# Patient Record
Sex: Female | Born: 1995 | Race: Black or African American | Hispanic: No | Marital: Single | Smoking: Never smoker
Health system: Southern US, Community
[De-identification: ages and names within clinical notes are randomized; demographics above are authoritative.]

## PROBLEM LIST (undated history)

## (undated) DIAGNOSIS — Z789 Other specified health status: Secondary | ICD-10-CM

## (undated) HISTORY — DX: Other specified health status: Z78.9

## (undated) HISTORY — PX: NO PAST SURGERIES: SHX2092

---

## 2019-03-10 NOTE — L&D Delivery Note (Addendum)
Patient is a 24 y.o. now G3P3 s/p NSVD at [redacted]w[redacted]d, who was admitted for IOL for hx of shoulder dystocia with second pregnancy, pelvis proven to 7lb4oz with last delivery.  She progressed with augmentation (cytotec, AROM) to complete and pushed 2 minutes to deliver head- LOA @1822 . Anterior shoulder behind pubic bone and shoulder dystocia identified. McRoberts and suprapubic pressure performed without resolution. Second hands called to room and Sparacino, DO arrived at bedside. NICU at bedside while other maneuvers performed. Patient repositioned to right side lying and posterior arm delivered. Left clavicle broken with delivery of posterior arm. Delivery of body after delivery of posterior arm @1824 . Cord clamped and cut by Sparacino, DO, handed to NICU for assessment. Sparacino, DO collected arterial cord gas. Placenta intact and spontaneous, bleeding minimal. No laceration identified.  Mom and baby stable prior to transfer to postpartum. She plans on breastfeeding. She requests POPs for birth control.  Delivery Note At 6:24 PM a viable and healthy female was delivered via Vaginal, Spontaneous (Presentation: Left Occiput Anterior).  APGAR: 9, ; weight 7 lb 13.6 oz (3560 g).   Placenta status: Spontaneous, Intact.  Cord: 3 vessels with the following complications:  Shoulder dystocia.  Cord pH: 7.3, bicarb 23.6  Anesthesia: None Episiotomy: None Lacerations: None Suture Repair: None Est. Blood Loss (mL):  250   Mom to postpartum.  Baby to Couplet care / Skin to Skin.  CNM 06/30/2019, 6:57 PM

## 2019-03-14 ENCOUNTER — Other Ambulatory Visit (HOSPITAL_COMMUNITY)
Admission: RE | Admit: 2019-03-14 | Discharge: 2019-03-14 | Disposition: A | Discharge status: Home / Self Care | Payer: Medicaid Other | Source: Ambulatory Visit | Attending: Obstetrics & Gynecology | Admitting: Obstetrics & Gynecology

## 2019-03-14 ENCOUNTER — Encounter: Payer: Self-pay | Admitting: Obstetrics & Gynecology

## 2019-03-14 ENCOUNTER — Ambulatory Visit (INDEPENDENT_AMBULATORY_CARE_PROVIDER_SITE_OTHER): Payer: Medicaid Other | Admitting: Obstetrics & Gynecology

## 2019-03-14 ENCOUNTER — Other Ambulatory Visit: Payer: Self-pay

## 2019-03-14 VITALS — BP 117/78 | HR 108 | Ht 64.0 in | Wt 223.0 lb

## 2019-03-14 DIAGNOSIS — Z3480 Encounter for supervision of other normal pregnancy, unspecified trimester: Secondary | ICD-10-CM | POA: Insufficient documentation

## 2019-03-14 DIAGNOSIS — O0932 Supervision of pregnancy with insufficient antenatal care, second trimester: Secondary | ICD-10-CM

## 2019-03-14 DIAGNOSIS — Z3A26 26 weeks gestation of pregnancy: Secondary | ICD-10-CM

## 2019-03-14 DIAGNOSIS — O09893 Supervision of other high risk pregnancies, third trimester: Secondary | ICD-10-CM

## 2019-03-14 DIAGNOSIS — O09892 Supervision of other high risk pregnancies, second trimester: Secondary | ICD-10-CM

## 2019-03-14 DIAGNOSIS — O0930 Supervision of pregnancy with insufficient antenatal care, unspecified trimester: Secondary | ICD-10-CM | POA: Insufficient documentation

## 2019-03-14 NOTE — Progress Notes (Signed)
Had a pap with last pregnancy in 2020

## 2019-03-14 NOTE — Progress Notes (Signed)
  Subjective:    Kelly Walter is being seen today for her first obstetrical visit.  This is not a planned pregnancy. She is at [redacted]w[redacted]d gestation. Her obstetrical history is significant for obesity and late prenatal care, short interval between pregnancies. Relationship with FOB: significant other, living together. Patient does not intend to breast feed. Pregnancy history fully reviewed.  Patient reports no complaints.  Review of Systems:   Review of Systems  She reports all normal vaginal deliveries at term.   Objective:     BP 117/78   Pulse (!) 108   Ht 5\' 4"  (1.626 m)   Wt 223 lb (101.2 kg)   LMP 09/13/2018 (Exact Date)   BMI 38.28 kg/m  Physical Exam  Exam  GENERAL: Well-developed, well-nourished female in no acute distress.  HEENT: Normocephalic, atraumatic. Sclerae anicteric.  NECK: Supple. Normal thyroid.  LUNGS: Clear to auscultation bilaterally.  HEART: Regular rate and rhythm. BREASTS: Symmetric with everted nipples. No masses, skin changes, nipple drainage, or lymphadenopathy. ABDOMEN: Soft, nontender, nondistended. No organomegaly. PELVIC: Normal external female genitalia. Vagina is pink and rugated.  Normal discharge. Normal cervix contour.  No adnexal mass or tenderness.  EXTREMITIES: No cyanosis, clubbing, or edema, 2+ distal pulses.      Assessment:    Pregnancy: 11/14/2018 Patient Active Problem List   Diagnosis Date Noted  . Supervision of other normal pregnancy, antepartum 03/14/2019  . Late prenatal care 03/14/2019  . Short interval between pregnancies affecting pregnancy in third trimester, antepartum 03/14/2019       Plan:     Initial labs drawn. Prenatal vitamins. Problem list reviewed and updated. Role of ultrasound in pregnancy discussed; fetal survey: ordered. Amniocentesis discussed: not indicated. Follow up in 2 weeks for 2 hour GTT    Abbegayle Denault C Hortencia Martire 03/14/2019

## 2019-03-15 ENCOUNTER — Encounter: Payer: Self-pay | Admitting: Obstetrics & Gynecology

## 2019-03-15 DIAGNOSIS — O99210 Obesity complicating pregnancy, unspecified trimester: Secondary | ICD-10-CM | POA: Insufficient documentation

## 2019-03-15 LAB — OBSTETRIC PANEL, INCLUDING HIV
Antibody Screen: NEGATIVE
Basophils Absolute: 0 10*3/uL (ref 0.0–0.2)
Basos: 0 %
EOS (ABSOLUTE): 0.1 10*3/uL (ref 0.0–0.4)
Eos: 1 %
HIV Screen 4th Generation wRfx: NONREACTIVE
Hematocrit: 33.3 % — ABNORMAL LOW (ref 34.0–46.6)
Hemoglobin: 10.9 g/dL — ABNORMAL LOW (ref 11.1–15.9)
Hepatitis B Surface Ag: NEGATIVE
Immature Grans (Abs): 0.1 10*3/uL (ref 0.0–0.1)
Immature Granulocytes: 1 %
Lymphocytes Absolute: 1.4 10*3/uL (ref 0.7–3.1)
Lymphs: 23 %
MCH: 27.3 pg (ref 26.6–33.0)
MCHC: 32.7 g/dL (ref 31.5–35.7)
MCV: 84 fL (ref 79–97)
Monocytes Absolute: 0.4 10*3/uL (ref 0.1–0.9)
Monocytes: 7 %
Neutrophils Absolute: 4.1 10*3/uL (ref 1.4–7.0)
Neutrophils: 68 %
Platelets: 271 10*3/uL (ref 150–450)
RBC: 3.99 x10E6/uL (ref 3.77–5.28)
RDW: 13.3 % (ref 11.7–15.4)
RPR Ser Ql: NONREACTIVE
Rh Factor: POSITIVE
Rubella Antibodies, IGG: 9.33 index (ref >0.99)
WBC: 6 10*3/uL (ref 3.4–10.8)

## 2019-03-15 LAB — HEMOGLOBIN A1C
Est. average glucose Bld gHb Est-mCnc: 91 mg/dL
Hgb A1c MFr Bld: 4.8 % (ref 4.8–5.6)

## 2019-03-16 LAB — GC/CHLAMYDIA PROBE AMP (~~LOC~~) NOT AT ARMC
Chlamydia: NEGATIVE
Comment: NEGATIVE
Comment: NORMAL
Neisseria Gonorrhea: NEGATIVE

## 2019-03-16 LAB — URINE CULTURE, OB REFLEX

## 2019-03-16 LAB — CULTURE, OB URINE

## 2019-03-17 ENCOUNTER — Encounter: Payer: Self-pay | Admitting: Obstetrics & Gynecology

## 2019-03-17 ENCOUNTER — Telehealth: Payer: Self-pay | Admitting: *Deleted

## 2019-03-17 DIAGNOSIS — O99019 Anemia complicating pregnancy, unspecified trimester: Secondary | ICD-10-CM | POA: Insufficient documentation

## 2019-03-17 NOTE — Telephone Encounter (Signed)
-----   Message from Faythe Casa, RN sent at 03/17/2019  9:32 AM EST ----- Regarding: Medication Management Was sent to [place] bu mistake.  Please advise.    Addison Naegeli, RN ----- Message ----- From: Allie Bossier, MD Sent: 03/17/2019   8:08 AM EST To: [facility]  Please let her know that she has mild anemia and should take iron daily. Thanks

## 2019-03-22 ENCOUNTER — Telehealth: Payer: Self-pay | Admitting: *Deleted

## 2019-03-22 NOTE — Telephone Encounter (Signed)
Attempted to call pt, both phone numbers ring busy.

## 2019-03-22 NOTE — Telephone Encounter (Signed)
-----   Message from Jeanetta M Bellamy, RN sent at 03/17/2019  9:32 AM EST ----- Regarding: Medication Management Was sent to us bu mistake.  Please advise.    Jeanetta, RN ----- Message ----- From: Dove, Myra C, MD Sent: 03/17/2019   8:08 AM EST To: [facility]  Please let her know that she has mild anemia and should take iron daily. Thanks   

## 2019-03-27 ENCOUNTER — Other Ambulatory Visit (HOSPITAL_COMMUNITY): Payer: Self-pay | Admitting: *Deleted

## 2019-03-27 ENCOUNTER — Other Ambulatory Visit: Payer: Self-pay | Admitting: Obstetrics & Gynecology

## 2019-03-27 ENCOUNTER — Other Ambulatory Visit: Payer: Self-pay

## 2019-03-27 ENCOUNTER — Ambulatory Visit (HOSPITAL_COMMUNITY)
Admission: RE | Admit: 2019-03-27 | Discharge: 2019-03-27 | Disposition: A | Discharge status: Home / Self Care | Payer: Medicaid Other | Source: Ambulatory Visit | Attending: Obstetrics and Gynecology | Admitting: Obstetrics and Gynecology

## 2019-03-27 DIAGNOSIS — O99212 Obesity complicating pregnancy, second trimester: Secondary | ICD-10-CM

## 2019-03-27 DIAGNOSIS — O0932 Supervision of pregnancy with insufficient antenatal care, second trimester: Secondary | ICD-10-CM

## 2019-03-27 DIAGNOSIS — O09892 Supervision of other high risk pregnancies, second trimester: Secondary | ICD-10-CM

## 2019-03-27 DIAGNOSIS — Z3480 Encounter for supervision of other normal pregnancy, unspecified trimester: Secondary | ICD-10-CM | POA: Diagnosis present

## 2019-03-27 DIAGNOSIS — Z362 Encounter for other antenatal screening follow-up: Secondary | ICD-10-CM

## 2019-03-27 DIAGNOSIS — Z3A25 25 weeks gestation of pregnancy: Secondary | ICD-10-CM

## 2019-03-30 ENCOUNTER — Encounter: Payer: Self-pay | Admitting: Obstetrics and Gynecology

## 2019-03-30 ENCOUNTER — Encounter: Payer: Medicaid Other | Admitting: Obstetrics and Gynecology

## 2019-03-31 NOTE — Progress Notes (Signed)
Patient did not keep her OB appointment for 03/30/2019.  Cornelia Copa MD Attending Center for [employer] [facility])

## 2019-04-12 ENCOUNTER — Encounter: Payer: Medicaid Other | Admitting: Advanced Practice Midwife

## 2019-04-12 ENCOUNTER — Telehealth: Payer: Self-pay | Admitting: Radiology

## 2019-04-12 ENCOUNTER — Telehealth (HOSPITAL_COMMUNITY): Payer: Self-pay

## 2019-04-12 NOTE — Telephone Encounter (Signed)
Made multiple attempts to contact patient on number provided. Phone rang busy, unable to send mychart visit due to patient not setting up. Contacted MFM to ask patient to call office to reschedule appointment that she missed to 04/12/19.

## 2019-04-20 ENCOUNTER — Telehealth: Payer: Self-pay | Admitting: Radiology

## 2019-04-20 ENCOUNTER — Encounter: Payer: Medicaid Other | Admitting: Family Medicine

## 2019-04-20 NOTE — Telephone Encounter (Signed)
Patient called after hours stating that she is waiting for COVID results and will call back to reschedule appointment from 04/20/19 for 30 WK ROB/GTT

## 2019-04-24 ENCOUNTER — Ambulatory Visit (HOSPITAL_COMMUNITY): Payer: Medicaid Other | Attending: Obstetrics

## 2019-04-24 ENCOUNTER — Encounter (HOSPITAL_COMMUNITY): Payer: Self-pay

## 2019-04-24 ENCOUNTER — Ambulatory Visit (HOSPITAL_COMMUNITY): Payer: Medicaid Other

## 2019-05-18 ENCOUNTER — Telehealth: Payer: Self-pay | Admitting: *Deleted

## 2019-05-18 NOTE — Telephone Encounter (Signed)
Attempted to call pt to get her scheduled for ROB visit. Phone number rang busy, and pt does not have mychart.

## 2019-05-23 ENCOUNTER — Other Ambulatory Visit: Payer: Self-pay

## 2019-05-23 ENCOUNTER — Other Ambulatory Visit (HOSPITAL_COMMUNITY)
Admission: RE | Admit: 2019-05-23 | Discharge: 2019-05-23 | Disposition: A | Discharge status: Home / Self Care | Payer: Medicaid Other | Source: Ambulatory Visit | Attending: Obstetrics and Gynecology | Admitting: Obstetrics and Gynecology

## 2019-05-23 ENCOUNTER — Ambulatory Visit (INDEPENDENT_AMBULATORY_CARE_PROVIDER_SITE_OTHER): Payer: Medicaid Other | Admitting: Obstetrics and Gynecology

## 2019-05-23 VITALS — BP 103/70 | HR 93 | Wt 220.0 lb

## 2019-05-23 DIAGNOSIS — O09893 Supervision of other high risk pregnancies, third trimester: Secondary | ICD-10-CM

## 2019-05-23 DIAGNOSIS — Z3401 Encounter for supervision of normal first pregnancy, first trimester: Secondary | ICD-10-CM | POA: Diagnosis present

## 2019-05-23 DIAGNOSIS — Z3480 Encounter for supervision of other normal pregnancy, unspecified trimester: Secondary | ICD-10-CM

## 2019-05-23 DIAGNOSIS — Z8759 Personal history of other complications of pregnancy, childbirth and the puerperium: Secondary | ICD-10-CM | POA: Insufficient documentation

## 2019-05-23 DIAGNOSIS — O99213 Obesity complicating pregnancy, third trimester: Secondary | ICD-10-CM

## 2019-05-23 DIAGNOSIS — O283 Abnormal ultrasonic finding on antenatal screening of mother: Secondary | ICD-10-CM | POA: Insufficient documentation

## 2019-05-23 DIAGNOSIS — O0933 Supervision of pregnancy with insufficient antenatal care, third trimester: Secondary | ICD-10-CM | POA: Insufficient documentation

## 2019-05-23 DIAGNOSIS — E669 Obesity, unspecified: Secondary | ICD-10-CM | POA: Insufficient documentation

## 2019-05-23 DIAGNOSIS — Z3A36 36 weeks gestation of pregnancy: Secondary | ICD-10-CM

## 2019-05-23 DIAGNOSIS — O99210 Obesity complicating pregnancy, unspecified trimester: Secondary | ICD-10-CM

## 2019-05-23 NOTE — Progress Notes (Signed)
No concerns. 

## 2019-05-23 NOTE — Progress Notes (Signed)
Prenatal Visit Note Date: 05/23/2019 Clinic: [facility]  Subjective:  Kelly Walter is a 24 y.o. G3P2002 at [redacted]w[redacted]d being seen today for ongoing prenatal care.  She is currently monitored for the following issues for this low-risk pregnancy and has Supervision of other normal pregnancy, antepartum; Late prenatal care; Short interval between pregnancies affecting pregnancy in third trimester, antepartum; Obesity in pregnancy; Anemia in pregnancy; Echogenic intracardiac focus of fetus on prenatal ultrasound; Insufficient prenatal care in third trimester; History of shoulder dystocia in prior pregnancy; and Obesity (BMI 30-39.9) on their problem list.  Patient reports no complaints.   Contractions: Not present. Vag. Bleeding: None.  Movement: Present. Denies leaking of fluid.   The following portions of the patient's history were reviewed and updated as appropriate: allergies, current medications, past family history, past medical history, past social history, past surgical history and problem list. Problem list updated.  Objective:   Vitals:   05/23/19 0914  BP: 103/70  Pulse: 93  Weight: 220 lb (99.8 kg)    Fetal Status: Fetal Heart Rate (bpm): 160 Fundal Height: 35 cm Movement: Present  Presentation: Vertex  General:  Alert, oriented and cooperative. Patient is in no acute distress.  Skin: Skin is warm and dry. No rash noted.   Cardiovascular: Normal heart rate noted  Respiratory: Normal respiratory effort, no problems with respiration noted  Abdomen: Soft, gravid, appropriate for gestational age. Pain/Pressure: Present     Pelvic:  Cervical exam deferred        Extremities: Normal range of motion.  Edema: Trace  Mental Status: Normal mood and affect. Normal behavior. Normal judgment and thought content.   Urinalysis:      Assessment and Plan:  Pregnancy: G3P2002 at [redacted]w[redacted]d  1. Encounter for supervision of normal first pregnancy in first trimester Routine care.  BC options d/w her, namely ones she can get before hospital d/c - Glucose Tolerance, 2 Hours w/1 Hour - Culture, beta strep (group b only) - GC/Chlamydia probe amp ([place])not at [facility] - CBC - RPR - HIV antibody (with reflex)  2. Echogenic intracardiac focus of fetus on prenatal ultrasound Pt elects for exp management  3. Insufficient prenatal care in third trimester Follow up rpt u/s due to poor dating and growth (see below)  4. History of shoulder dystocia in prior pregnancy Care everywhere reviewed and PL updated. Pt states child is healthy and doing well w/o issue. First child was same size as 2nd and no SD in G1 delivery. I told her I recommend d/w her more after growth u/s but I dont recommend a c/s based on G2 delivery at this point  5. Obesity (BMI 30-39.9)  6. Supervision of other normal pregnancy, antepartum  7. Short interval between pregnancies affecting pregnancy in third trimester, antepartum  8. Obesity in pregnancy  Preterm labor symptoms and general obstetric precautions including but not limited to vaginal bleeding, contractions, leaking of fluid and fetal movement were reviewed in detail with the patient. Please refer to After Visit Summary for other counseling recommendations.  Return for virtual visit. Repeat growth u/s in next 1-2wks 7-10d low risk ob  [place]al[place] Bing, MD

## 2019-05-24 LAB — CBC
Hematocrit: 34.4 % (ref 34.0–46.6)
Hemoglobin: 10.9 g/dL — ABNORMAL LOW (ref 11.1–15.9)
MCH: 25.5 pg — ABNORMAL LOW (ref 26.6–33.0)
MCHC: 31.7 g/dL (ref 31.5–35.7)
MCV: 81 fL (ref 79–97)
Platelets: 242 10*3/uL (ref 150–450)
RBC: 4.27 x10E6/uL (ref 3.77–5.28)
RDW: 14.4 % (ref 11.7–15.4)
WBC: 4.5 10*3/uL (ref 3.4–10.8)

## 2019-05-24 LAB — GLUCOSE TOLERANCE, 2 HOURS W/ 1HR
Glucose, 1 hour: 120 mg/dL (ref 65–179)
Glucose, 2 hour: 93 mg/dL (ref 65–152)
Glucose, Fasting: 75 mg/dL (ref 65–91)

## 2019-05-24 LAB — RPR: RPR Ser Ql: NONREACTIVE

## 2019-05-24 LAB — GC/CHLAMYDIA PROBE AMP (~~LOC~~) NOT AT ARMC
Chlamydia: NEGATIVE
Comment: NEGATIVE
Comment: NORMAL
Neisseria Gonorrhea: NEGATIVE

## 2019-05-24 LAB — HIV ANTIBODY (ROUTINE TESTING W REFLEX): HIV Screen 4th Generation wRfx: NONREACTIVE

## 2019-05-27 LAB — CULTURE, BETA STREP (GROUP B ONLY): Strep Gp B Culture: NEGATIVE

## 2019-05-31 ENCOUNTER — Encounter: Payer: Self-pay | Admitting: Family Medicine

## 2019-05-31 ENCOUNTER — Telehealth (INDEPENDENT_AMBULATORY_CARE_PROVIDER_SITE_OTHER): Payer: Medicaid Other | Admitting: Family Medicine

## 2019-05-31 ENCOUNTER — Other Ambulatory Visit: Payer: Self-pay

## 2019-05-31 VITALS — Wt 220.0 lb

## 2019-05-31 DIAGNOSIS — O99210 Obesity complicating pregnancy, unspecified trimester: Secondary | ICD-10-CM

## 2019-05-31 DIAGNOSIS — O0933 Supervision of pregnancy with insufficient antenatal care, third trimester: Secondary | ICD-10-CM | POA: Diagnosis not present

## 2019-05-31 DIAGNOSIS — O99213 Obesity complicating pregnancy, third trimester: Secondary | ICD-10-CM

## 2019-05-31 DIAGNOSIS — Z3A37 37 weeks gestation of pregnancy: Secondary | ICD-10-CM

## 2019-05-31 DIAGNOSIS — Z8759 Personal history of other complications of pregnancy, childbirth and the puerperium: Secondary | ICD-10-CM

## 2019-05-31 DIAGNOSIS — Z3480 Encounter for supervision of other normal pregnancy, unspecified trimester: Secondary | ICD-10-CM

## 2019-05-31 DIAGNOSIS — O0930 Supervision of pregnancy with insufficient antenatal care, unspecified trimester: Secondary | ICD-10-CM

## 2019-05-31 DIAGNOSIS — O09293 Supervision of pregnancy with other poor reproductive or obstetric history, third trimester: Secondary | ICD-10-CM | POA: Diagnosis not present

## 2019-05-31 NOTE — Progress Notes (Signed)
I connected with@ on 06/03/19 at  2:15 PM EDT by: telephone- mychart connection pportand verified that I am speaking with the correct person using two identifiers.  Patient is located at [facility] on [address]  and provider is located at [facility].     The purpose of this virtual visit is to provide medical care while limiting exposure to the novel coronavirus. I discussed the limitations, risks, security and privacy concerns of performing an evaluation and management service by Mychart and the availability of in person appointments. I also discussed with the patient that there may be a patient responsible charge related to this service. By engaging in this virtual visit, you consent to the provision of healthcare.  Additionally, you authorize for your insurance to be billed for the services provided during this visit.  The patient expressed understanding and agreed to proceed.  The following staff members participated in the virtual visit:  Matthew Saras    PRENATAL VISIT NOTE  Subjective:  Kelly Walter is a 24 y.o. G3P2002 at [redacted]w[redacted]d  for phone visit for ongoing prenatal care.  She is currently monitored for the following issues for this high-risk pregnancy and has Supervision of other normal pregnancy, antepartum; Late prenatal care; Short interval between pregnancies affecting pregnancy in third trimester, antepartum; Obesity in pregnancy; Anemia in pregnancy; Echogenic intracardiac focus of fetus on prenatal ultrasound; Insufficient prenatal care in third trimester; History of shoulder dystocia in prior pregnancy; and Obesity (BMI 30-39.9) on their problem list.  Patient reports no complaints. Baby is moving well. Says she wants "check on her blood work to make sure the baby is ok because of her [place] and they saw a spot in the baby's heart." Denies leaking of fluid.   The following portions of the patient's history were reviewed and updated as appropriate: allergies, current medications, past family  history, past medical history, past social history, past surgical history and problem list.   Objective:   Vitals:   05/31/19 1434  Weight: 220 lb (99.8 kg)   Self-Obtained  Fetal Status:          Movement present  Assessment and Plan:  Pregnancy: G3P2002 at [redacted]w[redacted]d  1. Obesity in pregnancy TWG=0 lb (0 kg)   2. Supervision of other normal pregnancy, antepartum Up to date 36 wks labs negative Patient asking about NIPs today Asked about plans for contraception and she is unsure  3. Late prenatal care Explained that genetic screening was not done her per request at the her first prenatal visit at 26 wks. She would like NIPT today because she "wants to know if the spot in her baby's heart is something to worry about." I explained that NIPT might not be available prior to delivery given she is late to request this. Additionally she has only had 2 other visits this pregnancy despite this telehealth visit.   4. History of shoulder dystocia in prior pregnancy [place] tomorrow to assess fetal weight  Term labor symptoms and general obstetric precautions including but not limited to vaginal bleeding, contractions, leaking of fluid and fetal movement were reviewed in detail with the patient.  Return in about 2 weeks (around 06/14/2019) for Routine prenatal care, Telehealth/Virtual health OB Visit.  Future Appointments  Date Time Provider Department Center  06/06/2019  9:30 AM [place] Bing, MD [facility]     Time spent on virtual visit: 10 minutes  Federico Flake, MD

## 2019-05-31 NOTE — Progress Notes (Signed)
I connected with  [name] on 05/31/19 at  2:15 PM EDT by telephone and verified that I am speaking with the correct person using two identifiers.   I discussed the limitations, risks, security and privacy concerns of performing an evaluation and management service by telephone and the availability of in person appointments. I also discussed with the patient that there may be a patient responsible charge related to this service. The patient expressed understanding and agreed to proceed.  Clerance Umland Emeline Darling, [facility] 05/31/2019  2:35 PM  Patient has not taken blood pressure

## 2019-06-01 ENCOUNTER — Ambulatory Visit (HOSPITAL_COMMUNITY)
Admission: RE | Admit: 2019-06-01 | Discharge: 2019-06-01 | Disposition: A | Discharge status: Home / Self Care | Payer: Medicaid Other | Source: Ambulatory Visit | Attending: Obstetrics | Admitting: Obstetrics

## 2019-06-01 ENCOUNTER — Other Ambulatory Visit: Payer: Self-pay

## 2019-06-01 DIAGNOSIS — O09893 Supervision of other high risk pregnancies, third trimester: Secondary | ICD-10-CM

## 2019-06-01 DIAGNOSIS — Z362 Encounter for other antenatal screening follow-up: Secondary | ICD-10-CM | POA: Diagnosis present

## 2019-06-01 DIAGNOSIS — E669 Obesity, unspecified: Secondary | ICD-10-CM

## 2019-06-01 DIAGNOSIS — Z3687 Encounter for antenatal screening for uncertain dates: Secondary | ICD-10-CM | POA: Diagnosis not present

## 2019-06-01 DIAGNOSIS — O0933 Supervision of pregnancy with insufficient antenatal care, third trimester: Secondary | ICD-10-CM | POA: Diagnosis not present

## 2019-06-01 DIAGNOSIS — O99213 Obesity complicating pregnancy, third trimester: Secondary | ICD-10-CM

## 2019-06-01 DIAGNOSIS — Z3A35 35 weeks gestation of pregnancy: Secondary | ICD-10-CM

## 2019-06-06 ENCOUNTER — Telehealth (INDEPENDENT_AMBULATORY_CARE_PROVIDER_SITE_OTHER): Payer: Medicaid Other | Admitting: Obstetrics and Gynecology

## 2019-06-06 ENCOUNTER — Other Ambulatory Visit: Payer: Self-pay

## 2019-06-06 DIAGNOSIS — Z3480 Encounter for supervision of other normal pregnancy, unspecified trimester: Secondary | ICD-10-CM

## 2019-06-06 DIAGNOSIS — E669 Obesity, unspecified: Secondary | ICD-10-CM | POA: Diagnosis not present

## 2019-06-06 DIAGNOSIS — Z6838 Body mass index (BMI) 38.0-38.9, adult: Secondary | ICD-10-CM

## 2019-06-06 DIAGNOSIS — O99210 Obesity complicating pregnancy, unspecified trimester: Secondary | ICD-10-CM

## 2019-06-06 DIAGNOSIS — Z8759 Personal history of other complications of pregnancy, childbirth and the puerperium: Secondary | ICD-10-CM

## 2019-06-06 NOTE — Progress Notes (Signed)
   TELEHEALTH VIRTUAL OBSTETRICS VISIT ENCOUNTER NOTE  Clinic: [facility]  I connected with Kelly Walter on 06/06/19 at  9:30 AM EDT by telephone at home and verified that I am speaking with the correct person using two identifiers.   I discussed the limitations, risks, security and privacy concerns of performing an evaluation and management service by telephone and the availability of in person appointments. I also discussed with the patient that there may be a patient responsible charge related to this service. The patient expressed understanding and agreed to proceed.  Subjective:  Kelly Walter is a 24 y.o. G3P2002 at [redacted]w[redacted]d being followed for ongoing prenatal care.  She is currently monitored for the following issues for this high-risk pregnancy and has Supervision of other normal pregnancy, antepartum; Late prenatal care; Short interval between pregnancies affecting pregnancy in third trimester, antepartum; Obesity in pregnancy; Echogenic intracardiac focus of fetus on prenatal ultrasound; Insufficient prenatal care in third trimester; History of shoulder dystocia in prior pregnancy; and Obesity (BMI 30-39.9) on their problem list.  Patient reports no complaints. Reports fetal movement. Denies any contractions, bleeding or leaking of fluid.   The following portions of the patient's history were reviewed and updated as appropriate: allergies, current medications, past family history, past medical history, past social history, past surgical history and problem list.   Objective:   Vitals:    Babyscripts Data Reviewed: not applicable  General:  Alert, oriented and cooperative.   Mental Status: Normal mood and affect perceived. Normal judgment and thought content.  Rest of physical exam deferred due to type of encounter  Assessment and Plan:  Pregnancy: G3P2002 at [redacted]w[redacted]d 1. History of shoulder dystocia in prior pregnancy Normal growth u/s last week and c/w prior  pregnancies. I told her that at this point I don't think she needs a c-section based purely on her G2 delivery. I told her that we'll keep a close watch of her labor curve and have extra help in the room for this delivery  2. Supervision of other normal pregnancy, antepartum Routine care  3. Obesity in pregnancy  4. Obesity (BMI 30-39.9)  Term labor symptoms and general obstetric precautions including but not limited to vaginal bleeding, contractions, leaking of fluid and fetal movement were reviewed in detail with the patient.  I discussed the assessment and treatment plan with the patient. The patient was provided an opportunity to ask questions and all were answered. The patient agreed with the plan and demonstrated an understanding of the instructions. The patient was advised to call back or seek an in-person office evaluation/go to [facility] at [facility] for any urgent or concerning symptoms. Please refer to After Visit Summary for other counseling recommendations.   I provided 7 minutes of non-face-to-face time during this encounter. The visit was conducted via MyChart-medicine  No follow-ups on file.  Future Appointments  Date Time Provider Department Center  06/15/2019  2:15 PM Anyanwu, Jethro Bastos, MD [facility]    [place] Bing, MD Center for [facility], [facility]

## 2019-06-15 ENCOUNTER — Encounter: Payer: Medicaid Other | Admitting: Obstetrics & Gynecology

## 2019-06-15 NOTE — Progress Notes (Deleted)
   Patient did not show up today for her scheduled appointment.   Shyniece Scripter, MD, FACOG Obstetrician & Gynecologist, [facility], [facility]  

## 2019-06-19 ENCOUNTER — Other Ambulatory Visit: Payer: Self-pay

## 2019-06-19 ENCOUNTER — Ambulatory Visit (INDEPENDENT_AMBULATORY_CARE_PROVIDER_SITE_OTHER): Payer: Medicaid Other | Admitting: Obstetrics and Gynecology

## 2019-06-19 VITALS — BP 116/77 | HR 103 | Wt 218.0 lb

## 2019-06-19 DIAGNOSIS — Z3483 Encounter for supervision of other normal pregnancy, third trimester: Secondary | ICD-10-CM

## 2019-06-19 DIAGNOSIS — Z3480 Encounter for supervision of other normal pregnancy, unspecified trimester: Secondary | ICD-10-CM

## 2019-06-19 DIAGNOSIS — Z3A37 37 weeks gestation of pregnancy: Secondary | ICD-10-CM

## 2019-06-19 NOTE — Progress Notes (Signed)
Prenatal Visit Note Date: 06/19/2019 Clinic: [facility]  Subjective:  Kelly Walter is a 24 y.o. G3P2002 at [redacted]w[redacted]d being seen today for ongoing prenatal care.  She is currently monitored for the following issues for this low-risk pregnancy and has Supervision of other normal pregnancy, antepartum; Late prenatal care; Short interval between pregnancies affecting pregnancy in third trimester, antepartum; Obesity in pregnancy; Echogenic intracardiac focus of fetus on prenatal ultrasound; Insufficient prenatal care in third trimester; History of shoulder dystocia in prior pregnancy; and Obesity (BMI 30-39.9) on their problem list.  Patient reports no complaints.   Contractions: Irregular. Vag. Bleeding: None.  Movement: Present. Denies leaking of fluid.   The following portions of the patient's history were reviewed and updated as appropriate: allergies, current medications, past family history, past medical history, past social history, past surgical history and problem list. Problem list updated.  Objective:   Vitals:   06/19/19 1416  BP: 116/77  Pulse: (!) 103  Weight: 218 lb (98.9 kg)    Fetal Status: Fetal Heart Rate (bpm): 138 Fundal Height: 37 cm Movement: Present  Presentation: Vertex  General:  Alert, oriented and cooperative. Patient is in no acute distress.  Skin: Skin is warm and dry. No rash noted.   Cardiovascular: Normal heart rate noted  Respiratory: Normal respiratory effort, no problems with respiration noted  Abdomen: Soft, gravid, appropriate for gestational age. Pain/Pressure: Absent     Pelvic:  Cervical exam performed        Extremities: Normal range of motion.  Edema: None  Mental Status: Normal mood and affect. Normal behavior. Normal judgment and thought content.   Urinalysis:      Assessment and Plan:  Pregnancy: G3P2002 at [redacted]w[redacted]d  1. Supervision of other normal pregnancy, antepartum Per last u/s, mfm changed her dates back two weeks.  Today, her updated gestational age is 37/4 days today. Rpt GBS done in case prior one expires. Patient prefers in person. OCPs or the patch - Strep Gp B NAA  Term labor symptoms and general obstetric precautions including but not limited to vaginal bleeding, contractions, leaking of fluid and fetal movement were reviewed in detail with the patient. Please refer to After Visit Summary for other counseling recommendations.  Return in about 1 week (around 06/26/2019) for low risk, in person.   [place] Bing, MD

## 2019-06-21 LAB — STREP GP B NAA: Strep Gp B NAA: NEGATIVE

## 2019-06-27 ENCOUNTER — Ambulatory Visit (INDEPENDENT_AMBULATORY_CARE_PROVIDER_SITE_OTHER): Payer: Medicaid Other | Admitting: Obstetrics and Gynecology

## 2019-06-27 ENCOUNTER — Other Ambulatory Visit: Payer: Self-pay

## 2019-06-27 VITALS — BP 114/76 | HR 96 | Wt 219.6 lb

## 2019-06-27 DIAGNOSIS — Z3480 Encounter for supervision of other normal pregnancy, unspecified trimester: Secondary | ICD-10-CM

## 2019-06-27 DIAGNOSIS — Z8759 Personal history of other complications of pregnancy, childbirth and the puerperium: Secondary | ICD-10-CM

## 2019-06-27 DIAGNOSIS — Z3A38 38 weeks gestation of pregnancy: Secondary | ICD-10-CM

## 2019-06-27 NOTE — Progress Notes (Signed)
Prenatal Visit Note Date: 06/27/2019 Clinic: [facility]  Subjective:  Kelly Walter is a 24 y.o. G3P2002 at [redacted]w[redacted]d being seen today for ongoing prenatal care.  She is currently monitored for the following issues for this high-risk pregnancy and has Supervision of other normal pregnancy, antepartum; Late prenatal care; Short interval between pregnancies affecting pregnancy in third trimester, antepartum; Obesity in pregnancy; Echogenic intracardiac focus of fetus on prenatal ultrasound; Insufficient prenatal care in third trimester; History of shoulder dystocia in prior pregnancy; and Obesity (BMI 30-39.9) on their problem list.  Patient reports no complaints.   Contractions: Not present. Vag. Bleeding: None.  Movement: Present. Denies leaking of fluid.   The following portions of the patient's history were reviewed and updated as appropriate: allergies, current medications, past family history, past medical history, past social history, past surgical history and problem list. Problem list updated.  Objective:   Vitals:   06/27/19 1412  BP: 114/76  Pulse: 96  Weight: 219 lb 9.6 oz (99.6 kg)    Fetal Status: Fetal Heart Rate (bpm): 132 Fundal Height: 39 cm Movement: Present  Presentation: Vertex  General:  Alert, oriented and cooperative. Patient is in no acute distress.  Skin: Skin is warm and dry. No rash noted.   Cardiovascular: Normal heart rate noted  Respiratory: Normal respiratory effort, no problems with respiration noted  Abdomen: Soft, gravid, appropriate for gestational age. Pain/Pressure: Absent     Pelvic:  Cervical exam performed Dilation: 1 Effacement (%): 50 Station: Ballotable  Extremities: Normal range of motion.  Edema: Trace  Mental Status: Normal mood and affect. Normal behavior. Normal judgment and thought content.   Urinalysis:      Assessment and Plan:  Pregnancy: G3P2002 at [redacted]w[redacted]d  1. Supervision of other normal pregnancy,  antepartum Routine care  2. History of shoulder dystocia in prior pregnancy 3/25 growth with efw 67%, 2737gm, ac 90% with normal afi and cephalic. Pt had 3290gm SD with last pregnancy (see PL). D/w her and that I feel it would be reasonable as a multip to offer her a 39wk elective IOL to potentially decrease risk of SD. Pt amenable to this. Will set up for patient  Term labor symptoms and general obstetric precautions including but not limited to vaginal bleeding, contractions, leaking of fluid and fetal movement were reviewed in detail with the patient. Please refer to After Visit Summary for other counseling recommendations.  Return in about 1 week (around 07/04/2019) for low risk virtual okay.   [place] Bing, MD

## 2019-06-28 ENCOUNTER — Other Ambulatory Visit: Payer: Self-pay | Admitting: Advanced Practice Midwife

## 2019-06-28 ENCOUNTER — Telehealth (HOSPITAL_COMMUNITY): Payer: Self-pay | Admitting: *Deleted

## 2019-06-28 NOTE — Telephone Encounter (Signed)
Preadmission screen  

## 2019-06-29 ENCOUNTER — Other Ambulatory Visit (HOSPITAL_COMMUNITY)
Admission: RE | Admit: 2019-06-29 | Discharge: 2019-06-29 | Disposition: A | Discharge status: Home / Self Care | Payer: Medicaid Other | Source: Ambulatory Visit | Attending: Family Medicine | Admitting: Family Medicine

## 2019-06-29 LAB — SARS CORONAVIRUS 2 (TAT 6-24 HRS): SARS Coronavirus 2: NEGATIVE

## 2019-06-30 ENCOUNTER — Inpatient Hospital Stay (HOSPITAL_COMMUNITY): Payer: Medicaid Other

## 2019-06-30 ENCOUNTER — Other Ambulatory Visit: Payer: Self-pay

## 2019-06-30 ENCOUNTER — Encounter (HOSPITAL_COMMUNITY): Payer: Self-pay | Admitting: Obstetrics and Gynecology

## 2019-06-30 ENCOUNTER — Inpatient Hospital Stay (HOSPITAL_COMMUNITY)
Admission: AD | Admit: 2019-06-30 | Discharge: 2019-07-02 | DRG: 807 | Disposition: A | Discharge status: Home / Self Care | Payer: Medicaid Other | Attending: Obstetrics and Gynecology | Admitting: Obstetrics and Gynecology

## 2019-06-30 DIAGNOSIS — E669 Obesity, unspecified: Secondary | ICD-10-CM | POA: Diagnosis present

## 2019-06-30 DIAGNOSIS — Z3A39 39 weeks gestation of pregnancy: Secondary | ICD-10-CM

## 2019-06-30 DIAGNOSIS — O99214 Obesity complicating childbirth: Secondary | ICD-10-CM | POA: Diagnosis present

## 2019-06-30 DIAGNOSIS — Z3490 Encounter for supervision of normal pregnancy, unspecified, unspecified trimester: Secondary | ICD-10-CM | POA: Diagnosis present

## 2019-06-30 DIAGNOSIS — O0930 Supervision of pregnancy with insufficient antenatal care, unspecified trimester: Secondary | ICD-10-CM

## 2019-06-30 DIAGNOSIS — Z8759 Personal history of other complications of pregnancy, childbirth and the puerperium: Secondary | ICD-10-CM

## 2019-06-30 DIAGNOSIS — Z20822 Contact with and (suspected) exposure to covid-19: Secondary | ICD-10-CM | POA: Diagnosis present

## 2019-06-30 DIAGNOSIS — Z3480 Encounter for supervision of other normal pregnancy, unspecified trimester: Secondary | ICD-10-CM

## 2019-06-30 DIAGNOSIS — O26893 Other specified pregnancy related conditions, third trimester: Secondary | ICD-10-CM | POA: Diagnosis present

## 2019-06-30 LAB — CBC
HCT: 32.8 % — ABNORMAL LOW (ref 36.0–46.0)
Hemoglobin: 10 g/dL — ABNORMAL LOW (ref 12.0–15.0)
MCH: 24.6 pg — ABNORMAL LOW (ref 26.0–34.0)
MCHC: 30.5 g/dL (ref 30.0–36.0)
MCV: 80.6 fL (ref 80.0–100.0)
Platelets: 255 10*3/uL (ref 150–400)
RBC: 4.07 MIL/uL (ref 3.87–5.11)
RDW: 15.2 % (ref 11.5–15.5)
WBC: 4.2 10*3/uL (ref 4.0–10.5)
nRBC: 0 % (ref 0.0–0.2)

## 2019-06-30 LAB — TYPE AND SCREEN
ABO/RH(D): AB POS
Antibody Screen: NEGATIVE

## 2019-06-30 LAB — RPR: RPR Ser Ql: NONREACTIVE

## 2019-06-30 LAB — ABO/RH: ABO/RH(D): AB POS

## 2019-06-30 MED ORDER — ACETAMINOPHEN 325 MG PO TABS
650.0000 mg | ORAL_TABLET | ORAL | Status: DC | PRN
  Administered 2019-07-01 – 2019-07-02 (×2): 650 mg via ORAL
  Filled 2019-06-30 (×2): qty 2

## 2019-06-30 MED ORDER — OXYCODONE HCL 5 MG PO TABS
5.0000 mg | ORAL_TABLET | ORAL | Status: DC | PRN
  Administered 2019-07-01 – 2019-07-02 (×2): 5 mg via ORAL
  Filled 2019-06-30 (×2): qty 1

## 2019-06-30 MED ORDER — ONDANSETRON HCL 4 MG PO TABS: 4.0000 mg | ORAL_TABLET | ORAL | Status: DC | PRN

## 2019-06-30 MED ORDER — DIPHENHYDRAMINE HCL 25 MG PO CAPS: 25.0000 mg | ORAL_CAPSULE | Freq: Four times a day (QID) | ORAL | Status: DC | PRN

## 2019-06-30 MED ORDER — LACTATED RINGERS IV SOLN: INTRAVENOUS | Status: DC

## 2019-06-30 MED ORDER — MISOPROSTOL 25 MCG QUARTER TABLET
25.0000 ug | ORAL_TABLET | ORAL | Status: DC | PRN
  Administered 2019-06-30: 25 ug via VAGINAL
  Filled 2019-06-30: qty 1

## 2019-06-30 MED ORDER — PRENATAL MULTIVITAMIN CH
1.0000 | ORAL_TABLET | Freq: Every day | ORAL | Status: DC
  Administered 2019-07-01 – 2019-07-02 (×2): 1 via ORAL
  Filled 2019-06-30 (×2): qty 1

## 2019-06-30 MED ORDER — IBUPROFEN 600 MG PO TABS
600.0000 mg | ORAL_TABLET | Freq: Four times a day (QID) | ORAL | Status: DC
  Administered 2019-07-01 – 2019-07-02 (×7): 600 mg via ORAL
  Filled 2019-06-30 (×7): qty 1

## 2019-06-30 MED ORDER — OXYTOCIN 40 UNITS IN NORMAL SALINE INFUSION - SIMPLE MED
2.5000 [IU]/h | INTRAVENOUS | Status: DC
  Filled 2019-06-30: qty 1000

## 2019-06-30 MED ORDER — FENTANYL CITRATE (PF) 100 MCG/2ML IJ SOLN
50.0000 ug | INTRAMUSCULAR | Status: DC | PRN
  Filled 2019-06-30: qty 2

## 2019-06-30 MED ORDER — ZOLPIDEM TARTRATE 5 MG PO TABS: 5.0000 mg | ORAL_TABLET | Freq: Every evening | ORAL | Status: DC | PRN

## 2019-06-30 MED ORDER — SOD CITRATE-CITRIC ACID 500-334 MG/5ML PO SOLN: 30.0000 mL | ORAL | Status: DC | PRN

## 2019-06-30 MED ORDER — FENTANYL CITRATE (PF) 100 MCG/2ML IJ SOLN
100.0000 ug | INTRAMUSCULAR | Status: DC | PRN
  Administered 2019-06-30: 100 ug via INTRAVENOUS

## 2019-06-30 MED ORDER — WITCH HAZEL-GLYCERIN EX PADS: 1.0000 "application " | MEDICATED_PAD | CUTANEOUS | Status: DC | PRN

## 2019-06-30 MED ORDER — COCONUT OIL OIL: 1.0000 "application " | TOPICAL_OIL | Status: DC | PRN

## 2019-06-30 MED ORDER — ONDANSETRON HCL 4 MG/2ML IJ SOLN: 4.0000 mg | Freq: Four times a day (QID) | INTRAMUSCULAR | Status: DC | PRN

## 2019-06-30 MED ORDER — DIPHENHYDRAMINE HCL 50 MG/ML IJ SOLN: 12.5000 mg | INTRAMUSCULAR | Status: DC | PRN

## 2019-06-30 MED ORDER — BENZOCAINE-MENTHOL 20-0.5 % EX AERO: 1.0000 "application " | INHALATION_SPRAY | CUTANEOUS | Status: DC | PRN

## 2019-06-30 MED ORDER — FENTANYL-BUPIVACAINE-NACL 0.5-0.125-0.9 MG/250ML-% EP SOLN
12.0000 mL/h | EPIDURAL | Status: DC | PRN
  Filled 2019-06-30: qty 250

## 2019-06-30 MED ORDER — PROMETHAZINE HCL 25 MG/ML IJ SOLN
25.0000 mg | Freq: Once | INTRAMUSCULAR | Status: AC
  Administered 2019-06-30: 25 mg via INTRAVENOUS
  Filled 2019-06-30: qty 1

## 2019-06-30 MED ORDER — TERBUTALINE SULFATE 1 MG/ML IJ SOLN: 0.2500 mg | Freq: Once | INTRAMUSCULAR | Status: DC | PRN

## 2019-06-30 MED ORDER — ONDANSETRON HCL 4 MG/2ML IJ SOLN: 4.0000 mg | INTRAMUSCULAR | Status: DC | PRN

## 2019-06-30 MED ORDER — OXYTOCIN 40 UNITS IN NORMAL SALINE INFUSION - SIMPLE MED: 1.0000 m[IU]/min | INTRAVENOUS | Status: DC

## 2019-06-30 MED ORDER — SENNOSIDES-DOCUSATE SODIUM 8.6-50 MG PO TABS
2.0000 | ORAL_TABLET | ORAL | Status: DC
  Administered 2019-07-01 (×2): 2 via ORAL
  Filled 2019-06-30 (×2): qty 2

## 2019-06-30 MED ORDER — OXYTOCIN BOLUS FROM INFUSION
500.0000 mL | Freq: Once | INTRAVENOUS | Status: AC
  Administered 2019-06-30: 500 mL via INTRAVENOUS

## 2019-06-30 MED ORDER — LACTATED RINGERS IV SOLN: 500.0000 mL | INTRAVENOUS | Status: DC | PRN

## 2019-06-30 MED ORDER — DIBUCAINE (PERIANAL) 1 % EX OINT: 1.0000 "application " | TOPICAL_OINTMENT | CUTANEOUS | Status: DC | PRN

## 2019-06-30 MED ORDER — EPHEDRINE 5 MG/ML INJ: 10.0000 mg | INTRAVENOUS | Status: DC | PRN

## 2019-06-30 MED ORDER — ACETAMINOPHEN 325 MG PO TABS
650.0000 mg | ORAL_TABLET | ORAL | Status: DC | PRN
  Administered 2019-06-30: 650 mg via ORAL
  Filled 2019-06-30 (×2): qty 2

## 2019-06-30 MED ORDER — LACTATED RINGERS IV SOLN: 500.0000 mL | Freq: Once | INTRAVENOUS | Status: DC

## 2019-06-30 MED ORDER — PHENYLEPHRINE 40 MCG/ML (10ML) SYRINGE FOR IV PUSH (FOR BLOOD PRESSURE SUPPORT): 80.0000 ug | PREFILLED_SYRINGE | INTRAVENOUS | Status: DC | PRN

## 2019-06-30 MED ORDER — LIDOCAINE HCL (PF) 1 % IJ SOLN: 30.0000 mL | INTRAMUSCULAR | Status: DC | PRN

## 2019-06-30 MED ORDER — TETANUS-DIPHTH-ACELL PERTUSSIS 5-2.5-18.5 LF-MCG/0.5 IM SUSP: 0.5000 mL | Freq: Once | INTRAMUSCULAR | Status: DC

## 2019-06-30 MED ORDER — SIMETHICONE 80 MG PO CHEW: 80.0000 mg | CHEWABLE_TABLET | ORAL | Status: DC | PRN

## 2019-06-30 NOTE — H&P (Addendum)
OBSTETRIC ADMISSION HISTORY AND PHYSICAL  Blanka Cavey is a 24 y.o. female G15P2002 with IUP at [redacted]w[redacted]d by ultrasound presenting for IOL for hx of shoulder dystocia with prior VD. She reports +FMs, No LOF, no VB, no blurry vision, headaches or peripheral edema, and RUQ pain.  She plans on bottle feeding. She request OCPs for birth control. She received her prenatal care at [facility]   Dating: By ultrasound on 3/25 --->  Estimated Date of Delivery: 07/06/19  Sono:    @[redacted]w[redacted]d , CWD, normal anatomy, cephalic presentation, 8295A, 67% EFW  Prenatal History/Complications:  - history of shoulder dystocia in prior pregnancy  - late prenatal care  - short interval between pregnancies  -obesity in pregnancy   Past Medical History: Past Medical History:  Diagnosis Date  . Medical history non-contributory     Past Surgical History: Past Surgical History:  Procedure Laterality Date  . NO PAST SURGERIES      Obstetrical History: OB History    Gravida  3   Para  2   Term  2   Preterm      AB      Living  2     SAB      TAB      Ectopic      Multiple      Live Births  2           Social History Social History   Socioeconomic History  . Marital status: Single    Spouse name: Not on file  . Number of children: Not on file  . Years of education: Not on file  . Highest education level: Not on file  Occupational History  . Not on file  Tobacco Use  . Smoking status: Never Smoker  . Smokeless tobacco: Never Used  Substance and Sexual Activity  . Alcohol use: Not Currently  . Drug use: Not Currently  . Sexual activity: Yes    Birth control/protection: None  Other Topics Concern  . Not on file  Social History Narrative  . Not on file   Social Determinants of Health   Financial Resource Strain:   . Difficulty of Paying Living Expenses:   Food Insecurity:   . Worried About Charity fundraiser in the Last Year:   . Arboriculturist in the Last Year:    Transportation Needs:   . Film/video editor (Medical):   Marland Kitchen Lack of Transportation (Non-Medical):   Physical Activity:   . Days of Exercise per Week:   . Minutes of Exercise per Session:   Stress:   . Feeling of Stress :   Social Connections:   . Frequency of Communication with Friends and Family:   . Frequency of Social Gatherings with Friends and Family:   . Attends Religious Services:   . Active Member of Clubs or Organizations:   . Attends Archivist Meetings:   Marland Kitchen Marital Status:     Family History: No family history on file.  Allergies: No Known Allergies  Medications Prior to Admission  Medication Sig Dispense Refill Last Dose  . Prenatal Vit-Fe Fumarate-FA (MULTIVITAMIN-PRENATAL) 27-0.8 MG TABS tablet Take 1 tablet by mouth daily at 12 noon.        Review of Systems   All systems reviewed and negative except as stated in HPI  Blood pressure 123/74, pulse (!) 105, temperature 98.6 F (37 C), temperature source Oral, resp. rate 16, last menstrual period 09/13/2018. General appearance: alert, cooperative,  appears stated age and no distress Lungs: clear to auscultation bilaterally Heart: regular rate and rhythm Abdomen: soft, non-tender; bowel sounds normal Pelvic: 2/40%/-2 Extremities:no sign of DVT Presentation: cephalic Fetal monitoringBaseline: 145 bpm, Variability: Good {> 6 bpm), Accelerations: Reactive and Decelerations: Absent Uterine activityNone     Prenatal labs: ABO, Rh: AB/Positive/-- (01/05 1507) Antibody: Negative (01/05 1507) Rubella: 9.33 (01/05 1507) RPR: Non Reactive (03/16 0936)  HBsAg: Negative (01/05 1507)  HIV: Non Reactive (03/16 0936)  GBS: Negative/-- (04/12 1451)  1 hr Glucola WNL Genetic screening  WNL Anatomy US WNL  Prenatal Transfer Tool  Maternal Diabetes: No Genetic Screening: Normal Maternal Ultrasounds/Referrals: Small echogenic focus on 1/18 ultrasound, no referral given. No problems noted on 3/25  ultrasound. Fetal Ultrasounds or other Referrals:  None Maternal Substance Abuse:  No Significant Maternal Medications:  None Significant Maternal Lab Results: None  No results found for this or any previous visit (from the past 24 hour(s)).  Patient Active Problem List   Diagnosis Date Noted  . Echogenic intracardiac focus of fetus on prenatal ultrasound 05/23/2019  . Insufficient prenatal care in third trimester 05/23/2019  . History of shoulder dystocia in prior pregnancy 05/23/2019  . Obesity (BMI 30-39.9) 05/23/2019  . Obesity in pregnancy 03/15/2019  . Supervision of other normal pregnancy, antepartum 03/14/2019  . Late prenatal care 03/14/2019  . Short interval between pregnancies affecting pregnancy in third trimester, antepartum 03/14/2019    Assessment/Plan:  Manette Doto is a 24 y.o. G3P2002 at [redacted]w[redacted]d here for IOL d/t hx of shoulder dystocia.  #Labor: Admit to L&D. Last VD: 3290g with shoulder dystocia. Anticipate VD. Will give cytotec x 1 then reassess.  #Pain: As patient requests, but for now does not want epidural #FWB:  145 bpm, Cat 1, 67% EFW at [redacted]w[redacted]d #ID: Negative #MOF: Bottle #MOC: OCPs #Circ:  N/a, anticipate baby girl  Calla Kicks, DO  06/30/2019, 10:27 AM  Attestation of Supervision of Student:  I confirm that I have verified the information documented in the resident student's note and that I have also personally reperformed the history, physical exam and all medical decision making activities.  I have verified that all services and findings are accurately documented in this student's note; and I agree with management and plan as outlined in the documentation. I have also made any necessary editorial changes.  Hx of shoulder dystocia at [employer]. According to chart review under care everywhere 3 maneuvers used including McRoberts, suprapubic pressure and woods screw.   Fetus 7lb 4.1oz (3290g). Patient denies complications with first  pregnancy and delivery.     Sharyon Cable, CNM [facility] [employer], [facility] 06/30/2019 11:45 PM

## 2019-06-30 NOTE — Discharge Summary (Signed)
Postpartum Discharge Summary     Patient Name: Kelly Walter DOB: 1995/12/01 MRN: [id number]  Date of admission: 06/30/2019 Delivering Provider: Lajean Manes   Date of discharge: 07/02/2019  Admitting diagnosis: History of shoulder dystocia in prior pregnancy [Z87.59] Intrauterine pregnancy: [redacted]w[redacted]d    Secondary diagnosis:  Active Problems:   Supervision of other normal pregnancy, antepartum   Late prenatal care   History of shoulder dystocia in prior pregnancy   Obesity (BMI 30-39.9)   Shoulder dystocia during labor and delivery, delivered   SVD (spontaneous vaginal delivery)   Encounter for induction of labor  Additional problems: none      Discharge diagnosis: Term Pregnancy Delivered and Shoulder dystocia                                                                                                 Post partum procedures:none  Augmentation: AROM and Cytotec  Complications: Shoulder dystocia   Hospital course:  Induction of Labor With Vaginal Delivery   24y.o. yo [id number] admitted to the hospital 06/30/2019 for induction of labor.  Indication for induction: history of shoulder dystocia .  Patient had an uncomplicated labor course as follows: Received Cytotec and AROM. Progressed to complete. Delivery complicated by 2 min shoulder dystocia with infant clavicle fracture.  Membrane Rupture Time/Date: 5:52 PM ,06/30/2019   Intrapartum Procedures: Episiotomy: None [1]                                         Lacerations:  None [1]  Patient had delivery of a Viable infant.  Information for the patient's newborn:  GrAhrianna, Siglin0[[id number]]Delivery Method: Vag-Spont    06/30/2019  Details of delivery can be found in separate delivery note.  Patient had a routine postpartum course. POPs prescribed on discharge. Patient is discharged home 07/02/19. Delivery time: 6:24 PM    Magnesium Sulfate received: No BMZ received:  No Rhophylac:N/A MMR:N/A Transfusion:No  Physical exam  Vitals:   07/01/19 0617 07/01/19 1000 07/01/19 1258 07/01/19 2123  BP: 101/64 110/76 109/69 (!) 97/56  Pulse: 94 81 77 85  Resp: _0 Temp: 98.6 F (37 C) 97.8 F (36.6 C) 98.1 F (36.7 C) 98.3 F (36.8 C)  TempSrc: Oral Oral Oral Oral  SpO2:  99% 100%    General: alert, cooperative and no distress Lochia: appropriate Uterine Fundus: firm Incision: N/A DVT Evaluation: No evidence of DVT seen on physical exam. Labs: Lab Results  Component Value Date   WBC 4.2 06/30/2019   HGB 10.0 (L) 06/30/2019   HCT 32.8 (L) 06/30/2019   MCV 80.6 06/30/2019   PLT 255 06/30/2019   No flowsheet data found. Edinburgh Score: No flowsheet data found.  Discharge instruction: per After Visit Summary and "Baby and Me Booklet".  After visit meds:  Allergies as of 07/02/2019   No Known Allergies     Medication List    TAKE these medications  acetaminophen 325 MG tablet Commonly known as: Tylenol Take 2 tablets (650 mg total) by mouth every 6 (six) hours as needed (for pain scale < 4).   ibuprofen 600 MG tablet Commonly known as: ADVIL Take 1 tablet (600 mg total) by mouth every 6 (six) hours.   multivitamin-prenatal 27-0.8 MG Tabs tablet Take 1 tablet by mouth daily at 12 noon.   norethindrone 0.35 MG tablet Commonly known as: MICRONOR Take 1 tablet (0.35 mg total) by mouth daily.   senna-docusate 8.6-50 MG tablet Commonly known as: Senokot-S Take 2 tablets by mouth daily. Start taking on: July 03, 2019       Diet: routine diet  Activity: Advance as tolerated. Pelvic rest for 6 weeks.   Outpatient follow up:4 weeks Follow up Appt: Future Appointments  Date Time Provider Department Center  07/06/2019 11:15 AM Pickens, Charlie, MD [facility]   Follow up Visit:    Please schedule this patient for Postpartum visit in: 4 weeks with the following provider: Any provider Virtual For C/S  patients schedule nurse incision check in weeks 2 weeks: no Low risk pregnancy complicated by: hx of shoulder dystocia  Delivery mode:  SVD Anticipated Birth Control:  POPs PP Procedures needed: none  Schedule Integrated BH visit: no   Newborn Data: Live born female  Birth Weight: 7 lb 13.6 oz (3560 g) APGAR (1 MIN): 9   APGAR (5 MINS): 9   APGAR (10 MINS):    Newborn Delivery   Time head delivered: 06/30/2019 18:22:00 Birth date/time: 06/30/2019 18:24:00 Delivery type: Vaginal, Spontaneous      Baby Feeding: Bottle Disposition:home with mother   07/02/2019 Chelsea N Fair, MD   

## 2019-06-30 NOTE — Progress Notes (Addendum)
Labor Progress Note Kelly Walter is a 24 y.o. G3P2002 at [redacted]w[redacted]d presented for IOL 2/2 history of shoulder dystocia in prior pregnancy.  S: Currently in quite a bit of pain, bending over and breathing heavily.   O:  BP 120/76   Pulse 87   Temp 98.3 F (36.8 C) (Oral)   Resp 18   LMP 09/13/2018 (Exact Date)  EFM: 140/moderate/some early decels  CVE: Dilation: 7 Effacement (%): 80 Cervical Position: Posterior Station: -1 Presentation: Vertex Exam by:: Janeice Robinson, RN   A&P: 24 y.o. O4R8412 [redacted]w[redacted]d by late [place] here for IOL 2/2 hx of shoulder dystocia for last delivery #Labor: S/p cytotec x1. Will stop medications at this time. Pitocin ordered but not started, will consider giving it if her contractions space out.  #Pain: Per patient request. Originally opted no epidural, but may reconsider. #FWB: Cat 2. 67% EFW at [redacted]w[redacted]d. #GBS negative  Dorothyann Gibbs, Medical Student 3:06 PM  I was present with medical student at bedside of patient. I agree with the above. Dorothe Pea, DO, PGY1

## 2019-06-30 NOTE — Progress Notes (Signed)
Labor Progress Note Mosella Kasa is a 24 y.o. G3P2002 at [redacted]w[redacted]d presented for IOL 2/2 hx of shoulder dystocia in prior pregnancy.   S: Patient feeling the need to push. Urge feels like she needs to have BM.   O:  BP 120/76   Pulse 87   Temp 98.3 F (36.8 C) (Oral)   Resp 18   LMP 09/13/2018 (Exact Date)   CVE: Dilation: 7 Effacement (%): 80 Cervical Position: Posterior Station: -1 Presentation: Vertex Exam by:: Janeice Robinson, RN   A&P: 24 y.o. H5T9122 [redacted]w[redacted]d here for IOL 2/2 hx of shoulder dystocia in prior pregnancy.  #Labor: Unchanged since last check. If no delivery or PROM in 2-3 hours, consider AROM #Pain: Patient asking for epidural #FWB: 130s baseline, moderate variability, early decels. 67% EFW at [redacted]w[redacted]d.  #GBS negative  Calla Kicks, DO 3:50 PM

## 2019-06-30 NOTE — Progress Notes (Signed)
LABOR PROGRESS NOTE  Kelly Walter is a Kelly y.o. G3P2002 at [redacted]w[redacted]d  admitted for IOL for hx of shoulder dystocia   Subjective: Patient breathing through contractions, feeling pressure during contractions   Objective: BP 114/62   Pulse (!) 106   Temp 98.3 F (36.8 C) (Oral)   Resp 20   LMP 09/13/2018 (Exact Date)  or  Vitals:   06/30/19 1307 06/30/19 1420 06/30/19 1552 06/30/19 1637  BP: (!) 101/55 120/76 118/73 114/62  Pulse: 75 87 (!) 101 (!) 106  Resp: 17 18 20    Temp:  98.3 F (36.8 C)    TempSrc:  Oral      AROM@1752 - clear fluid  Dilation: 8 Effacement (%): 90 Cervical Position: Middle Station: -1 Presentation: Vertex Exam by:: [email or web address] CNM FHT: baseline rate 140, moderate varibility, +accel, variable decel Toco: 2-5/ strong by palpation   Labs: Lab Results  Component Value Date   WBC 4.2 06/30/2019   HGB 10.0 (L) 06/30/2019   HCT 32.8 (L) 06/30/2019   MCV 80.6 06/30/2019   PLT 255 06/30/2019    Patient Active Problem List   Diagnosis Date Noted  . Echogenic intracardiac focus of fetus on prenatal ultrasound 05/23/2019  . Insufficient prenatal care in third trimester 05/23/2019  . History of shoulder dystocia in prior pregnancy 05/23/2019  . Obesity (BMI 30-39.9) 05/23/2019  . Obesity in pregnancy 03/15/2019  . Supervision of other normal pregnancy, antepartum 03/14/2019  . Late prenatal care 03/14/2019  . Short interval between pregnancies affecting pregnancy in third trimester, antepartum 03/14/2019    Assessment / Plan: Kelly y.o. G3P2002 at [redacted]w[redacted]d here for IOL for hx of Shoulder dystocia   Labor: Progressing well, will reassess in 2 hours if no delivery prior to that time  Fetal Wellbeing:  Cat II  Pain Control:  IV pain medication  Anticipated MOD:  SVD  [redacted]w[redacted]d, CNM 06/30/2019, 6:12 PM

## 2019-07-01 NOTE — Progress Notes (Signed)
POSTPARTUM PROGRESS NOTE  Post Partum Day 1  Subjective:  Kelly Walter is a 24 y.o. O7P0340 s/p NSVD at [redacted]w[redacted]d.  She reports she is doing well. No acute events overnight. She denies any problems with ambulating, voiding or po intake. Denies nausea or vomiting.  Pain is well controlled.  Lochia is less than a period.  Objective: Blood pressure 101/64, pulse 94, temperature 98.6 F (37 C), temperature source Oral, resp. rate 18, last menstrual period 09/13/2018, SpO2 96 %, unknown if currently breastfeeding.  Physical Exam:  General: alert, cooperative and no distress Chest: no respiratory distress Heart:regular rate, distal pulses intact Abdomen: soft, nontender,  Uterine Fundus: firm, appropriately tender DVT Evaluation: No calf swelling or tenderness Extremities: no LE edema Skin: warm, dry  Recent Labs    06/30/19 1004  HGB 10.0*  HCT 32.8*    Assessment/Plan: Kelly Walter is a 24 y.o. [id number] s/p NSVD at [redacted]w[redacted]d complicated by 2 min shoulder dystocia and infant clavicle fracture.   PPD#1 - Doing well  Routine postpartum care Contraception: POPs Feeding: bottle Dispo: Plan for discharge PPD#1-2 pending baby dispo.   LOS: 1 day   Zack Seal, MD/[facility] OB Fellow  07/01/2019, 8:00 AM

## 2019-07-02 MED ORDER — NORETHINDRONE 0.35 MG PO TABS
1.0000 | ORAL_TABLET | Freq: Every day | ORAL | 11 refills | Status: AC
Start: 2019-07-02 — End: ?

## 2019-07-02 MED ORDER — SENNOSIDES-DOCUSATE SODIUM 8.6-50 MG PO TABS: 2.0000 | ORAL_TABLET | ORAL | 0 refills | Status: AC

## 2019-07-02 MED ORDER — IBUPROFEN 600 MG PO TABS: 600.0000 mg | ORAL_TABLET | Freq: Four times a day (QID) | ORAL | 0 refills | Status: AC

## 2019-07-02 MED ORDER — ACETAMINOPHEN 325 MG PO TABS: 650.0000 mg | ORAL_TABLET | Freq: Four times a day (QID) | ORAL | 0 refills | Status: AC | PRN

## 2019-07-03 ENCOUNTER — Telehealth: Payer: Self-pay | Admitting: Radiology

## 2019-07-03 NOTE — Telephone Encounter (Signed)
Patient informed of postpartum appointment date and time

## 2019-07-04 ENCOUNTER — Other Ambulatory Visit: Payer: Self-pay

## 2019-07-04 ENCOUNTER — Encounter (HOSPITAL_COMMUNITY): Payer: Self-pay

## 2019-07-04 ENCOUNTER — Inpatient Hospital Stay (HOSPITAL_COMMUNITY)
Admission: AD | Admit: 2019-07-04 | Discharge: 2019-07-04 | Disposition: A | Discharge status: Home / Self Care | Payer: Medicaid Other | Source: Ambulatory Visit | Attending: Obstetrics & Gynecology | Admitting: Obstetrics & Gynecology

## 2019-07-04 ENCOUNTER — Inpatient Hospital Stay (HOSPITAL_COMMUNITY): Payer: Medicaid Other

## 2019-07-04 DIAGNOSIS — O9089 Other complications of the puerperium, not elsewhere classified: Secondary | ICD-10-CM

## 2019-07-04 DIAGNOSIS — O99893 Other specified diseases and conditions complicating puerperium: Secondary | ICD-10-CM | POA: Insufficient documentation

## 2019-07-04 DIAGNOSIS — R109 Unspecified abdominal pain: Secondary | ICD-10-CM | POA: Insufficient documentation

## 2019-07-04 LAB — URINALYSIS, ROUTINE W REFLEX MICROSCOPIC
Bacteria, UA: NONE SEEN
Bilirubin Urine: NEGATIVE
Glucose, UA: NEGATIVE mg/dL
Ketones, ur: NEGATIVE mg/dL
Leukocytes,Ua: NEGATIVE
Nitrite: NEGATIVE
Protein, ur: 100 mg/dL — AB
Specific Gravity, Urine: 1.023 (ref 1.005–1.030)
pH: 7 (ref 5.0–8.0)

## 2019-07-04 NOTE — MAU Provider Note (Signed)
History     CSN: [id number]  Arrival date and time: 07/04/19 1134   First Provider Initiated Contact with Patient 07/04/19 1408      Chief Complaint  Patient presents with  . Abdominal Pain   Ms.  Kelly Walter is a 24 y.o. year old G74P3003 female who is 4 days PP who presents to MAU reporting abdominal pain and passing blood clots when she gets up. She states the pain causes her to barely be able to walk. She does not have the urge to pee, unless she stands up. She is able to go with no problems. She had a vaginal delivery with no repairs.    OB History    Gravida  3   Para  3   Term  3   Preterm      AB      Living  3     SAB      TAB      Ectopic      Multiple  0   Live Births  3           Past Medical History:  Diagnosis Date  . Medical history non-contributory     Past Surgical History:  Procedure Laterality Date  . NO PAST SURGERIES      Family History  Problem Relation Age of Onset  . Healthy Mother     Social History   Tobacco Use  . Smoking status: Never Smoker  . Smokeless tobacco: Never Used  Substance Use Topics  . Alcohol use: Not Currently  . Drug use: Not Currently    Allergies: No Known Allergies  Medications Prior to Admission  Medication Sig Dispense Refill Last Dose  . acetaminophen (TYLENOL) 325 MG tablet Take 2 tablets (650 mg total) by mouth every 6 (six) hours as needed (for pain scale < 4). 30 tablet 0 07/04/2019 at 0600  . ibuprofen (ADVIL) 600 MG tablet Take 1 tablet (600 mg total) by mouth every 6 (six) hours. 30 tablet 0 07/03/2019 at 2200  . norethindrone (MICRONOR) 0.35 MG tablet Take 1 tablet (0.35 mg total) by mouth daily. 1 Package 11   . Prenatal Vit-Fe Fumarate-FA (MULTIVITAMIN-PRENATAL) 27-0.8 MG TABS tablet Take 1 tablet by mouth daily at 12 noon.   07/02/2019  . senna-docusate (SENOKOT-S) 8.6-50 MG tablet Take 2 tablets by mouth daily. 30 tablet 0 07/02/2019    Review of Systems  Constitutional:  Negative.   HENT: Negative.   Eyes: Negative.   Respiratory: Negative.   Cardiovascular: Negative.   Gastrointestinal: Negative.   Endocrine: Negative.   Genitourinary: Positive for pelvic pain and vaginal bleeding (passing clots when gets up to move around).  Musculoskeletal: Negative.   Skin: Negative.   Allergic/Immunologic: Negative.   Neurological: Negative.   Hematological: Negative.   Psychiatric/Behavioral: Negative.    Physical Exam   Blood pressure 120/77, pulse 86, temperature 99.5 F (37.5 C), temperature source Oral, resp. rate 18, height 5\' 4"  (1.626 m), weight 98.9 kg, SpO2 100 %.  Physical Exam  Nursing note and vitals reviewed. Constitutional: She is oriented to person, place, and time. She appears well-developed and well-nourished.  HENT:  Head: Normocephalic and atraumatic.  Eyes: Pupils are equal, round, and reactive to light.  Cardiovascular: Normal rate and regular rhythm.  Respiratory: Effort normal.  GI: Soft. There is no abdominal tenderness. There is no rebound.  Genitourinary:    Genitourinary Comments: Normal lochia on peripad; new peripad placed   Musculoskeletal:  General: Normal range of motion.     Cervical back: Normal range of motion.  Neurological: She is alert and oriented to person, place, and time.  Skin: Skin is warm and dry.  Psychiatric: She has a normal mood and affect. Her behavior is normal. Judgment and thought content normal.   MAU Course  Procedures  MDM Abdominal Exam Pelvic Complete U/S Reassessment @ 1645: minimal amount of normal lochia on peripad  US PELVIS (TRANSABDOMINAL ONLY) Result Date: 07/04/2019 CLINICAL DATA:  Postpartum pain, recent vaginal delivery on Friday 06/30/2019 EXAM: TRANSABDOMINAL ULTRASOUND OF PELVIS TECHNIQUE: Transabdominal ultrasound examination of the pelvis was performed including evaluation of the uterus, ovaries, adnexal regions, and pelvic cul-de-sac. COMPARISON:  None FINDINGS: Uterus  Measurements: 16.0 x 9.5 x 10.7 cm = volume: 853 mL. Enlarged postpartum appearance. Heterogeneous myometrium. No focal mass Endometrium Thickness: 5 mm. No endometrial fluid or focal abnormality. No retained products of conception identified Right ovary Measurements: 2.7 x 1.6 x 2.2 cm = volume: 5.0 mL. Normal morphology without mass Left ovary Measurements: 2.6 x 1.0 x 1.2 cm = volume: 1.6 mL. Normal morphology without mass Other findings:  No free pelvic fluid or adnexal masses. IMPRESSION: Enlarged postpartum uterus. Otherwise normal exam. Electronically Signed   By: Ulyses Southward M.D.   On: 07/04/2019 15:07    Assessment and Plan  Postpartum pain  - Advised that abdominal pain 4 days after vaginal delivery - Advised to take Ibuprofen 600 mg every 6 hours - Advised to drink plenty of water to stay well-hydrated Return to MAU:  If you have heavier bleeding that soaks through more that 2 pads per hour for an hour or more  If you bleed so much that you feel like you might pass out or you do pass out  If you have significant abdominal pain that is not improved with Tylenol   If you develop a fever > 100.5 - Discharge patient - Keep scheduled appt with [facility] - Patient verbalized an understanding of the plan of care and agrees.     Kelly Mora, MSN, CNM 07/04/2019, 2:08 PM

## 2019-07-04 NOTE — ED Triage Notes (Signed)
Patient complains of uterine cramping after [facility]. States that she called her OB and they directed her to hospital. Denies heavy bleeding, NAD

## 2019-07-04 NOTE — MAU Note (Signed)
Transfer from ED, having pain

## 2019-07-04 NOTE — MAU Note (Signed)
Delivered (vag del, no tears or repairs)on Friday, been cramping real bad- started in the hosp(taking Ibuprofen and Tylenol), feels cold (has not checked temp), can barely walk. No urge to pee unless she stands up, is able to go, has had a BM.

## 2019-07-04 NOTE — Discharge Instructions (Signed)
Return to MAU: °· If you have heavier bleeding that soaks through more that 2 pads per hour for an hour or more °· If you bleed so much that you feel like you might pass out or you do pass out °· If you have significant abdominal pain that is not improved with Tylenol  °· If you develop a fever > 100.5 °  °

## 2019-07-04 NOTE — ED Provider Notes (Signed)
24 year old female, is post vaginal delivery at 39 weeks 1 day followed by wound practice who presents for evaluation of abdominal pain and vaginal bleeding.  She is unable to quantify how much bleeding she is having.  She is currently wearing a pad.  Admits to lower abdominal cramping which she rates a 10/10.  Denies lightheadedness, dizziness or syncope.  Called her OB who told her to come to the emergency department for evaluation.  Patient appears overall well, no acute distress, no tachycardia, normal work of breathing, abdomen is soft with tenderness to lower abdomen, GU deferred, ambulatory without difficulty.   Consult with Denny Peon, MAU provider.  Agrees to accept patient in transfer.    MSE was initiated and I personally evaluated the patient and placed orders (if any) at  12:05 PM on July 04, 2019.  The patient appears stable so that the remainder of the MSE may be completed by another provider.   Keonta Monceaux A, PA-C 07/04/19 1205    Sabas Sous, MD 07/05/19 [phone]

## 2019-07-06 ENCOUNTER — Telehealth: Payer: Medicaid Other | Admitting: Obstetrics and Gynecology

## 2019-07-26 ENCOUNTER — Ambulatory Visit: Payer: Medicaid Other | Admitting: Family Medicine

## 2019-07-26 NOTE — Progress Notes (Deleted)
    Post Partum Visit Note  Kelly Walter is a 24 y.o. G17P3003 female who presents for a postpartum visit. She is {1-10:13787} {time; units:18646} postpartum following a {method of delivery:313099}.  I have fully reviewed the prenatal and intrapartum course. The delivery was at 39.1 gestational weeks.  Anesthesia: {anesthesia types:812}. Postpartum course has been uncomplicated. Baby is doing well ***. Baby is feeding by bottle. Bleeding {vag bleed:12292}. Bowel function is {normal:32111}. Bladder function is {normal:32111}. Patient {is/is not:9024} sexually active. Contraception method is {contraceptive method:5051}. Postpartum depression screening: {gen negative/positive:315881}.  {Common ambulatory SmartLinks:19316}  Review of Systems {ros; complete:30496}    Objective:  not currently breastfeeding.  General:  {gen appearance:16600}   Breasts:  {breast exam:1202::"inspection negative, no nipple discharge or bleeding, no masses or nodularity palpable"}  Lungs: {lung exam:16931}  Heart:  {heart exam:5510}  Abdomen: {abdomen exam:16834}   Vulva:  {labia exam:12198}  Vagina: {vagina exam:12200}  Cervix:  {cervix exam:14595}  Corpus: {uterus exam:12215}  Adnexa:  {adnexa exam:12223}  Rectal Exam: {rectal/vaginal exam:12274}        Assessment:    *** postpartum exam. Pap smear {done:10129} at today's visit.   Plan:   Essential components of care per ACOG recommendations:  1.  Mood and well being: Patient with {gen negative/positive:315881} depression screening today. Reviewed local resources for support.  - Patient {Action; does/does not:19097} use tobacco. ***If using tobacco we discussed reduction and for recently cessation risk of relapse - hx of drug use? {yes/no:20286}  *** If yes, discussed support systems  2. Infant care and feeding:  -Patient currently breastmilk feeding? {yes/no:20286} ***If breastmilk feeding discussed return to work and pumping. If needed, patient was  provided letter for work to allow for every 2-3 hr pumping breaks, and to be granted a private location to express breastmilk and refrigerated area to store breastmilk. Reviewed importance of draining breast regularly to support lactation. -Social determinants of health (SDOH) reviewed in EPIC. No concerns***The following needs were identified***  3. Sexuality, contraception and birth spacing - Patient {DOES_DOES WCH:85277} want a pregnancy in the next year.  Desired family size is {NUMBER 1-10:22536} children.  - Reviewed forms of contraception in tiered fashion. Patient desired {PLAN CONTRACEPTION:313102} today.   - Discussed birth spacing of 18 months  4. Sleep and fatigue -Encouraged family/partner/community support of 4 hrs of uninterrupted sleep to help with mood and fatigue  5. Physical Recovery  - Discussed patients delivery*** and complications - Patient had a *** degree laceration, perineal healing reviewed. Patient expressed understanding - Patient has urinary incontinence? {yes/no:20286}*** Patient was referred to pelvic floor PT  - Patient {ACTION; IS/IS OEU:23536144} safe to resume physical and sexual activity  6.  Health Maintenance - Last pap smear done *** and was {Normal/abnormal wildcard:19619} with negative HPV. ***Mammogram  7. ***Chronic Disease - PCP follow up  Eligah Anello A Cheree Ditto, CMA [facility] [employer], [facility]

## 2020-01-31 NOTE — Telephone Encounter (Signed)
NA

## 2020-04-07 IMAGING — US US MFM OB FOLLOW-UP
1 series · 14 of 28 positions shown · non-contrast
Comparison: none

[Series 1: us mfm ob follow-up · 53 acquisitions, 14 frames shown]
[im 2/53]
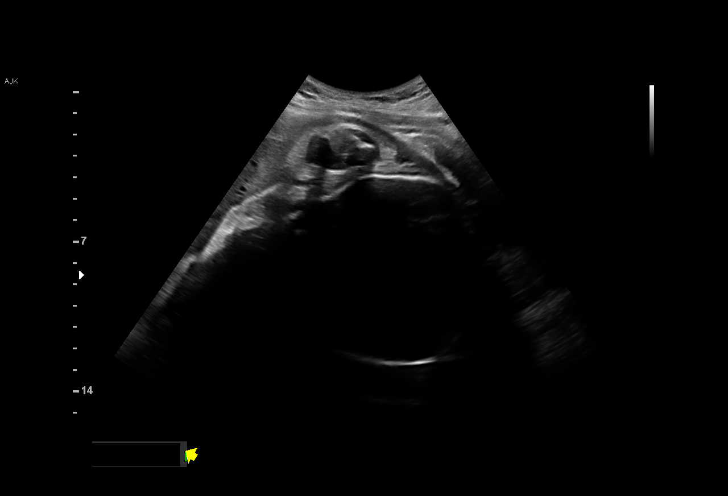
[im 6/53]
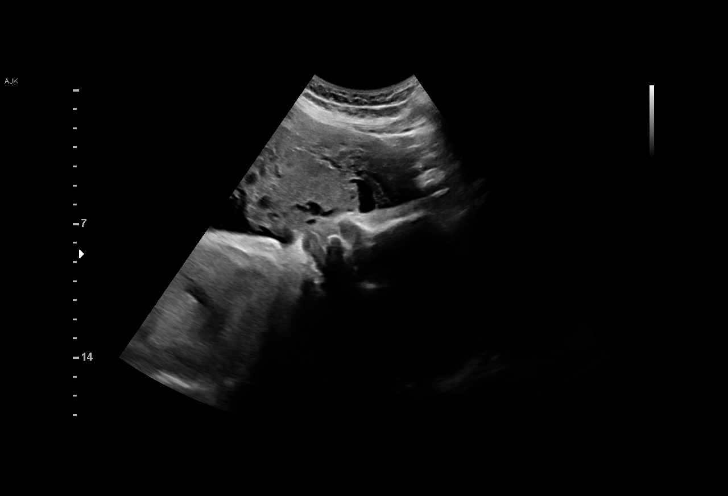
[im 10/53]
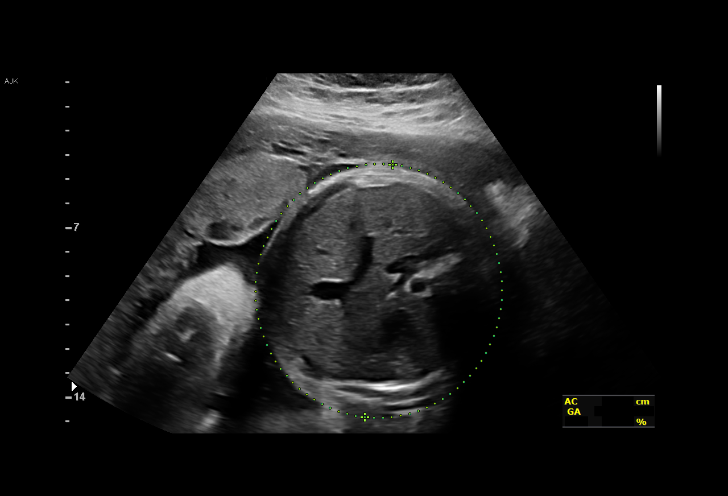
[im 14/53]
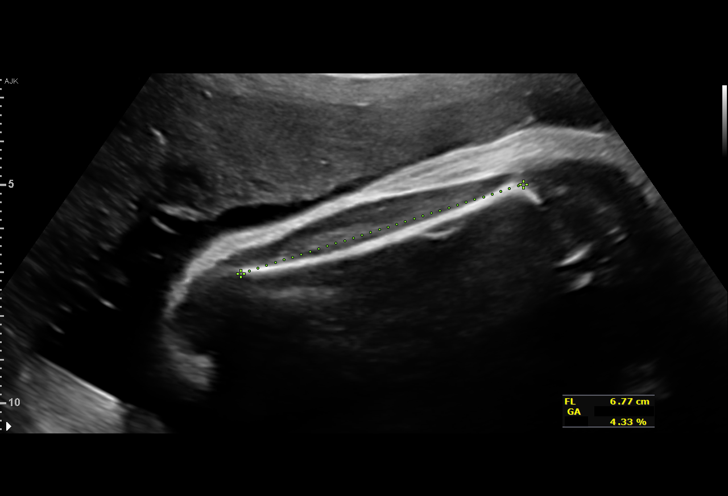
[im 18/53]
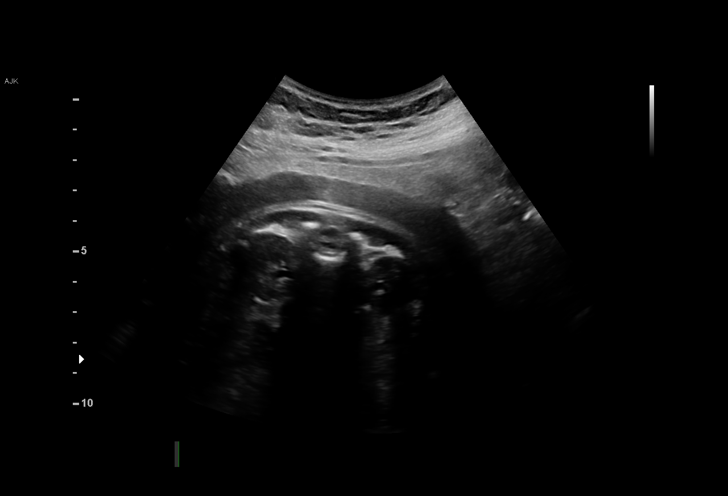
[im 22/53]
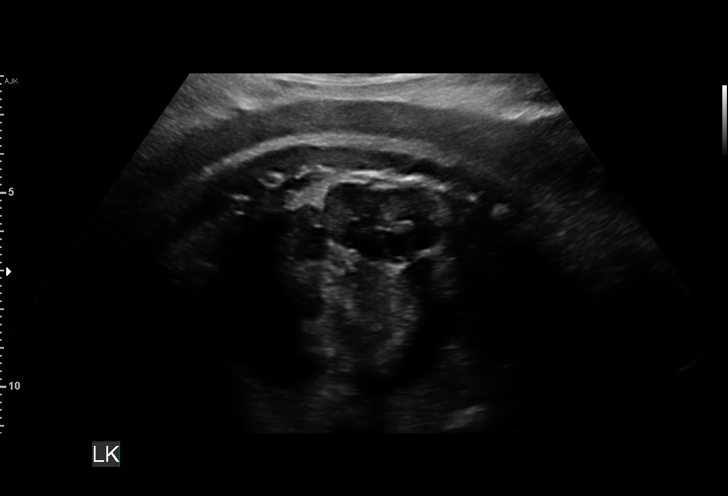
[im 26/53]
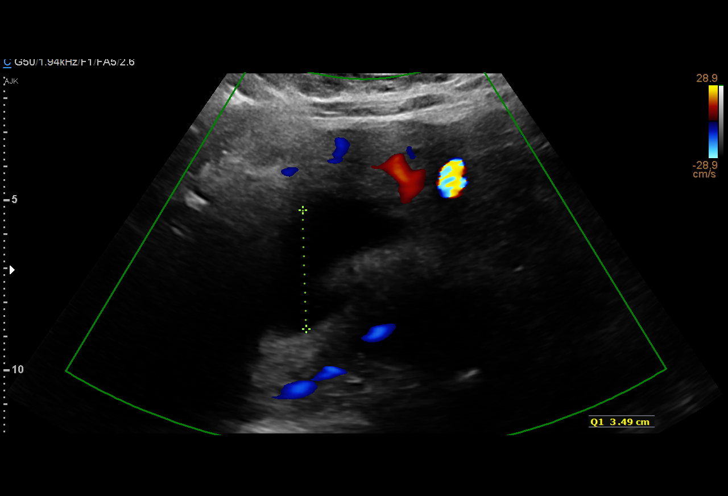
[im 29/53]
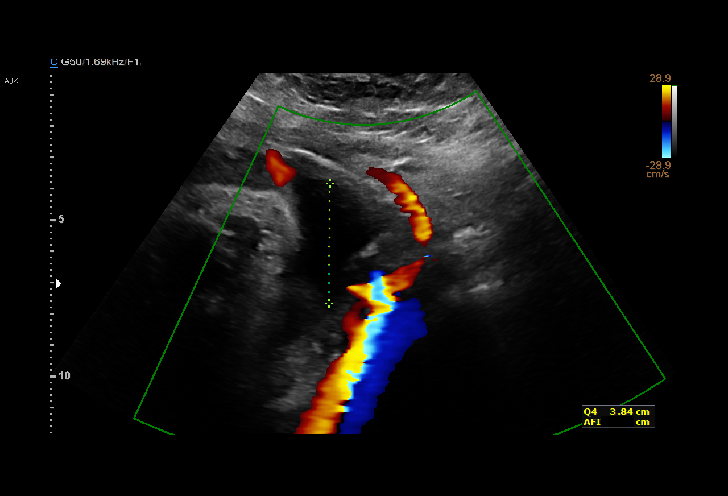
[im 33/53]
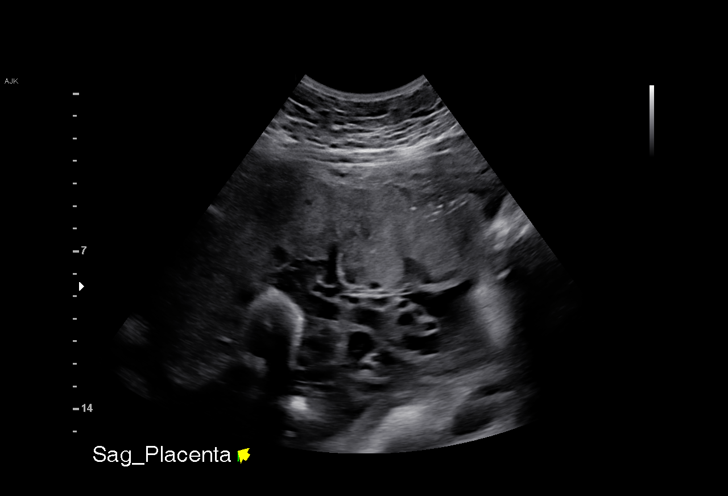
[im 37/53]
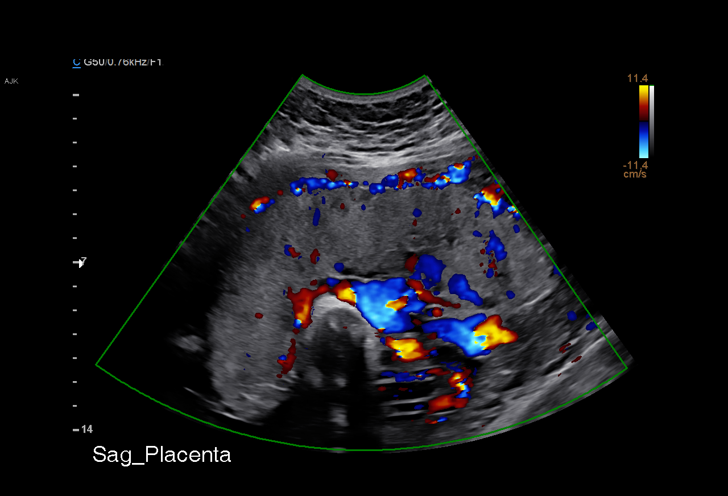
[im 41/53]
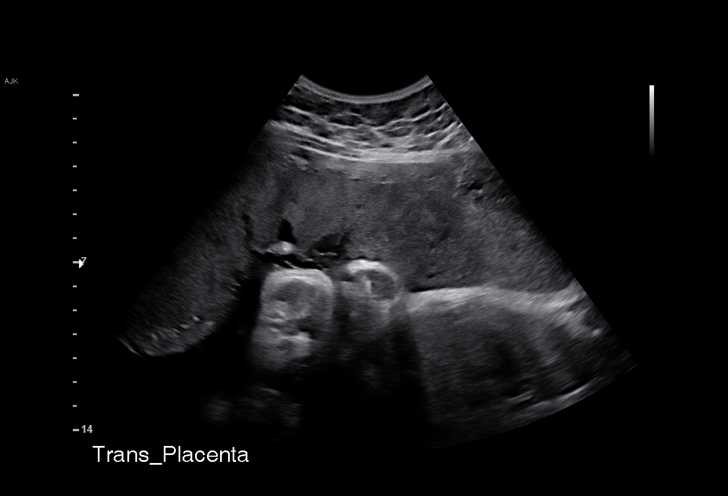
[im 45/53]
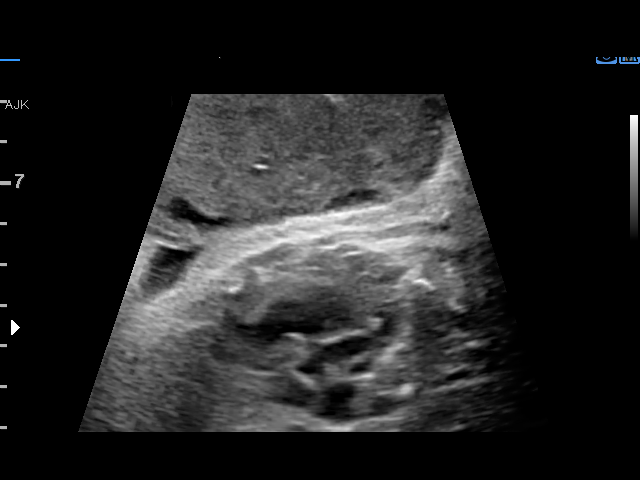
[im 49/53]
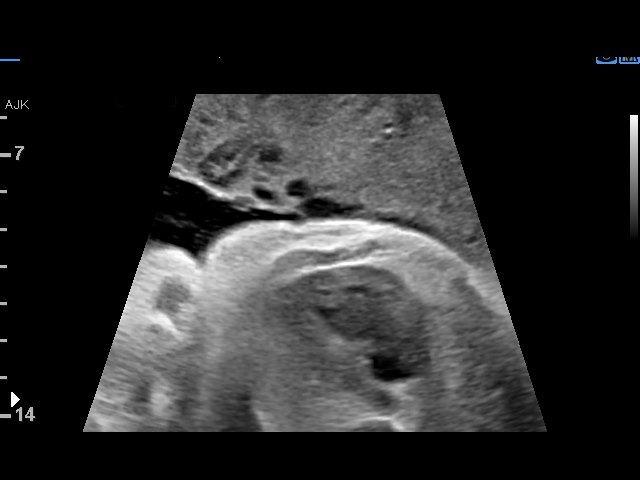
[im 53/53]
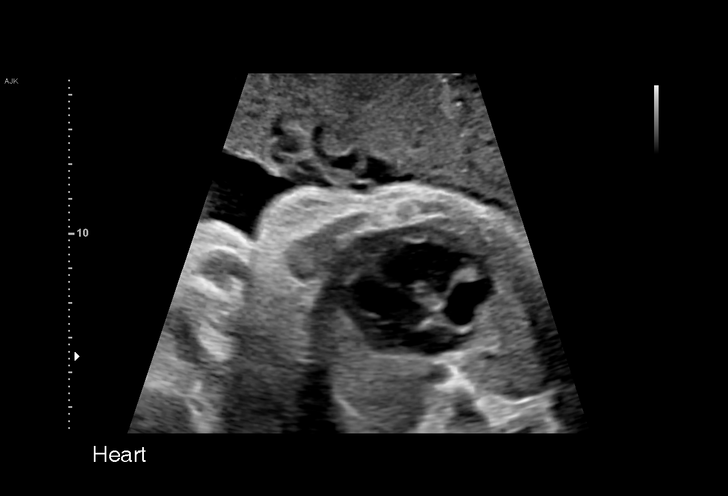

[14 of 28 positions shown; findings below may reference images not displayed]

----------------------------------------------------------------------

 ----------------------------------------------------------------------
Indications

  Late to prenatal care, third trimester
  35 weeks gestation of pregnancy
  Short interval between pregancies, 3rd
  trimester
  Obesity complicating pregnancy, third
  trimester
  Encounter for other antenatal screening
  follow-up
  Encounter for uncertain dates
 ----------------------------------------------------------------------
Vital Signs

                                                Height:        5'4"
Fetal Evaluation

 Num Of Fetuses:         1
 Fetal Heart Rate(bpm):  130
 Cardiac Activity:       Observed
 Presentation:           Cephalic
 Placenta:               Anterior
 P. Cord Insertion:      Visualized, central

 Amniotic Fluid
 AFI FV:      Within normal limits

 AFI Sum(cm)     %Tile       Largest Pocket(cm)
 12.78           41

 RUQ(cm)       RLQ(cm)       LUQ(cm)        LLQ(cm)

Biometry

 BPD:      87.3  mm     G. Age:  35w 2d         60  %    CI:         79.9   %    70 - 86
                                                         FL/HC:      21.9   %    20.1 -
 HC:      308.6  mm     G. Age:  34w 3d          9  %    HC/AC:      0.95        0.93 -
 AC:      325.4  mm     G. Age:  36w 3d         90  %    FL/BPD:     77.4   %    71 - 87
 FL:       67.6  mm     G. Age:  34w 5d         36  %    FL/AC:      20.8   %    20 - 24
 HUM:      60.5  mm     G. Age:  35w 1d         67  %

 Est. FW:    0555  gm      6 lb 1 oz     67  %
OB History

 Gravidity:    3         Term:   2
Gestational Age

 LMP:           37w 2d        Date:  09/13/18                 EDD:   06/20/19
 U/S Today:     35w 2d                                        EDD:   07/04/19
 Best:          35w 0d     Det. By:  U/S  (03/27/19)          EDD:   07/06/19
Anatomy

 Cranium:               Appears normal         LVOT:                   Appears normal
 Cavum:                 Appears normal         Aortic Arch:            Previously seen
 Ventricles:            Previously seen        Ductal Arch:            Previously seen
 Choroid Plexus:        Previously seen        Diaphragm:              Appears normal
 Cerebellum:            Previously seen        Stomach:                Appears normal, left
                                                                       sided
 Posterior Fossa:       Previously seen        Abdomen:                Appears normal
 Nuchal Fold:           Not applicable (>20    Abdominal Wall:         Previously seen
                        wks GA)
 Face:                  Orbits and profile     Cord Vessels:           Previously seen
                        previously seen
 Lips:                  Previously seen        Kidneys:                Appear normal
 Palate:                Previously seen        Bladder:                Appears normal
 Thoracic:              Appears normal         Spine:                  Previously seen
 Heart:                 Echogenic focus        Upper Extremities:      Previously seen
                        in LV prev seen
 RVOT:                  Appears normal         Lower Extremities:      Previously seen

 Other:  Nasal bone visualized. 5th digit visualized. Fetus appears to be
         female.
Comments

 This patient was seen for a follow up growth scan due to to
 confirm her dates as she presented late for prenatal care.
 She denies any problems since her last exam.
 She was informed that the fetal growth is consistent with EDC
 July 06, 2019.  This date should be used as her final EDC.
 There was normal amniotic fluid noted today.
 The patient was advised to continue routine prenatal care.
 No further exams were scheduled in our office.

## 2020-05-10 IMAGING — US US PELVIS COMPLETE
1 series · 15 of 25 positions shown · non-contrast
Comparison: None

CLINICAL DATA: Postpartum pain, recent vaginal delivery on [REDACTED]
06/30/2019

EXAM:
TRANSABDOMINAL ULTRASOUND OF PELVIS
TECHNIQUE: Transabdominal ultrasound examination of the pelvis was performed
including evaluation of the uterus, ovaries, adnexal regions, and
pelvic cul-de-sac.

[Series 1: us pelvis complete · 15 of 27 slices shown]
[im 1/27]
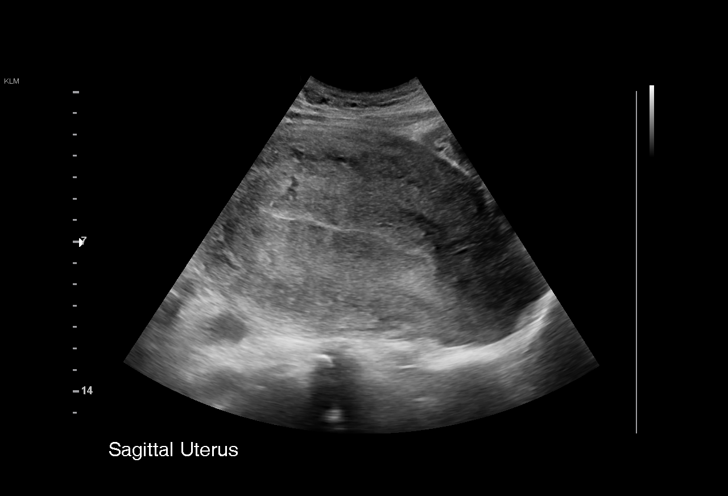
[im 3/27]
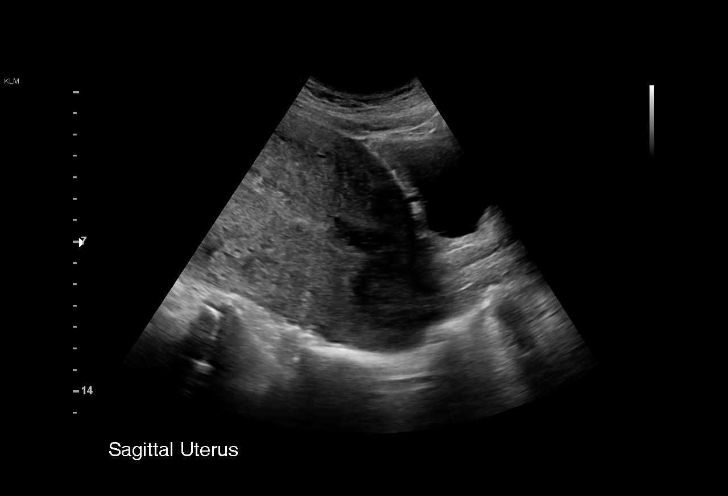
[im 5/27]
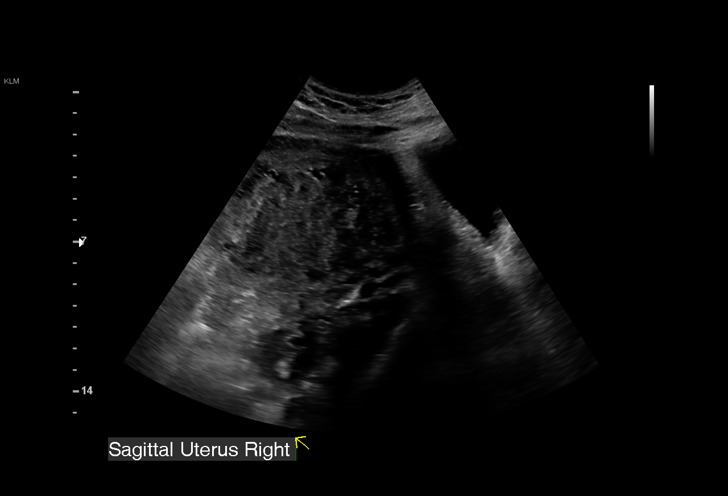
[im 6/27]
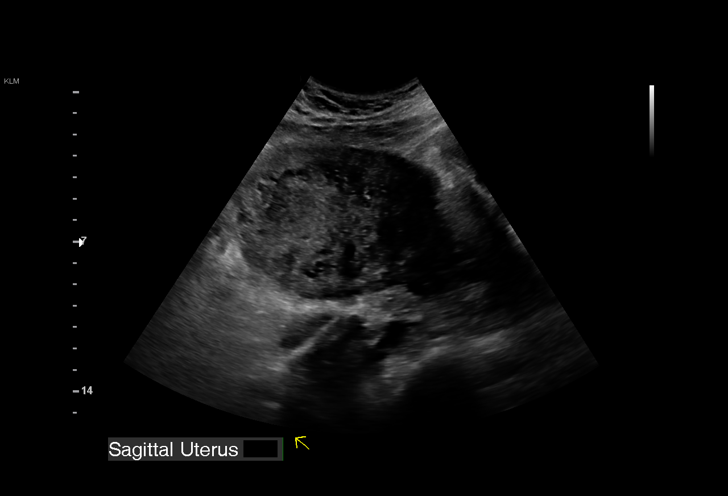
[im 8/27]
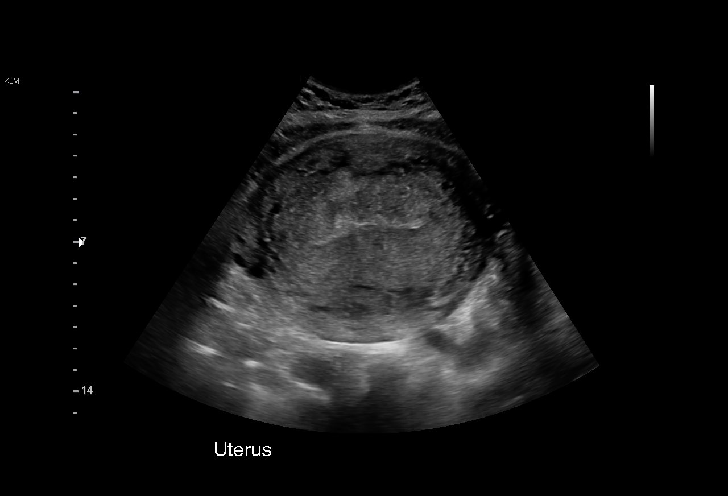
[im 10/27]
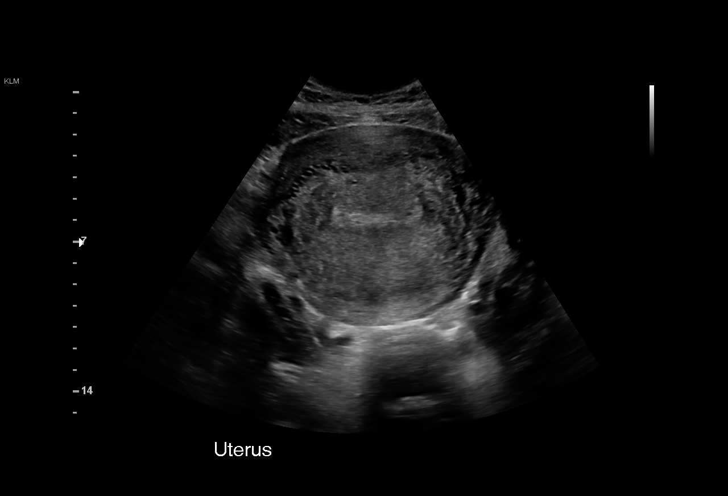
[im 11/27]
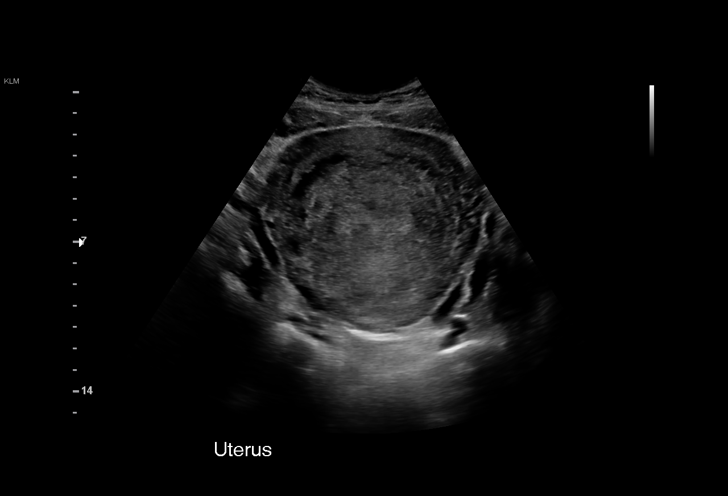
[im 14/27]
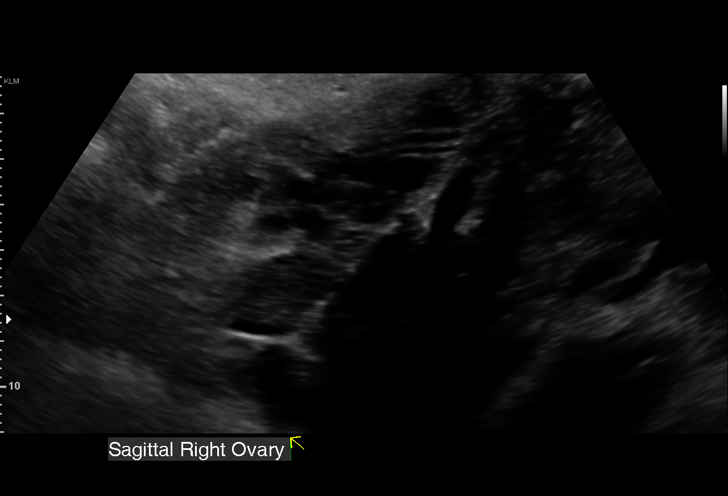
[im 16/27]
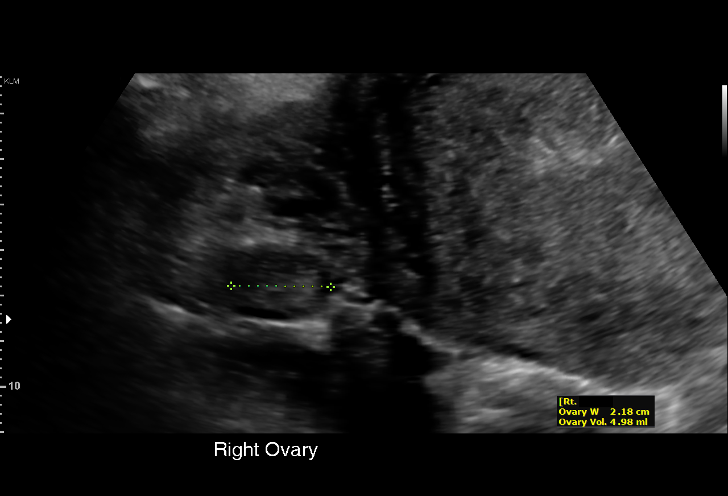
[im 17/27]
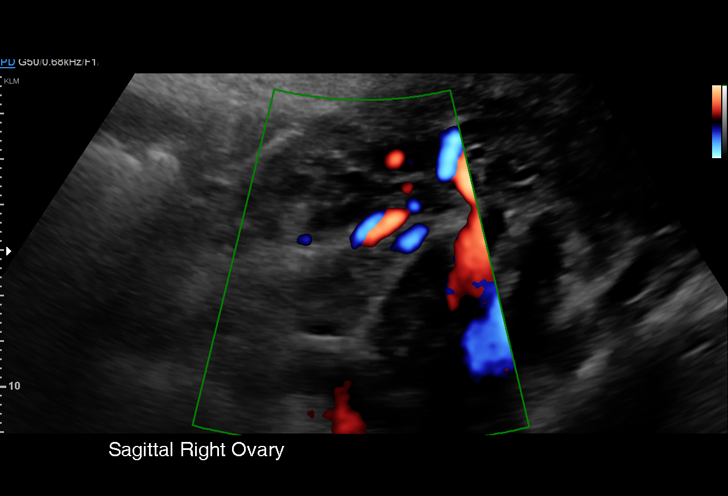
[im 19/27]
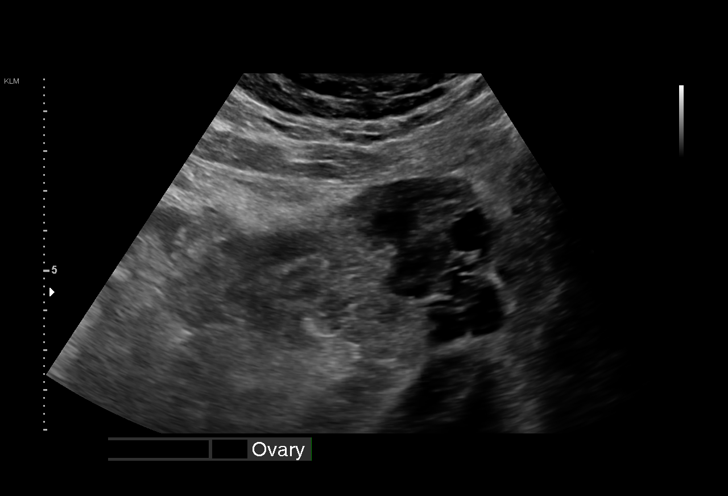
[im 21/27]
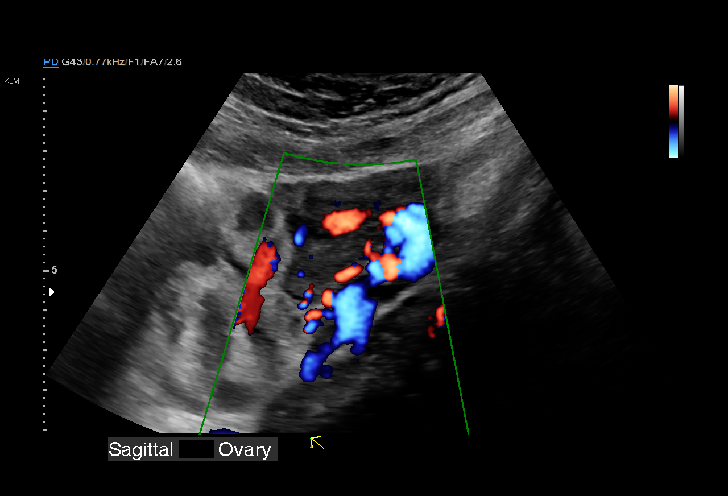
[im 22/27]
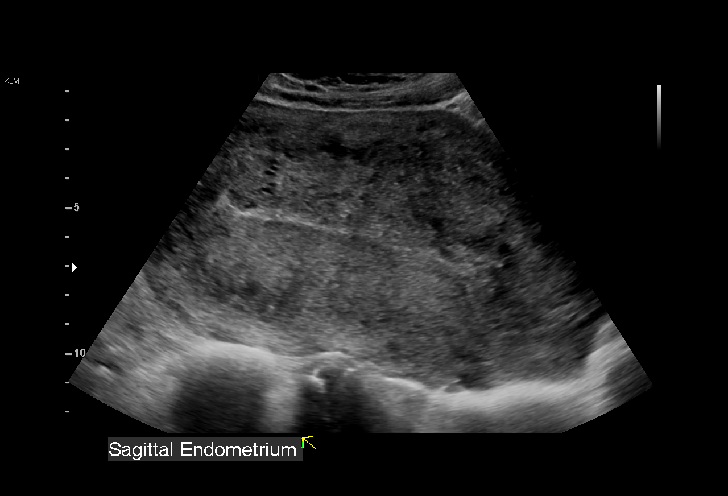
[im 24/27]
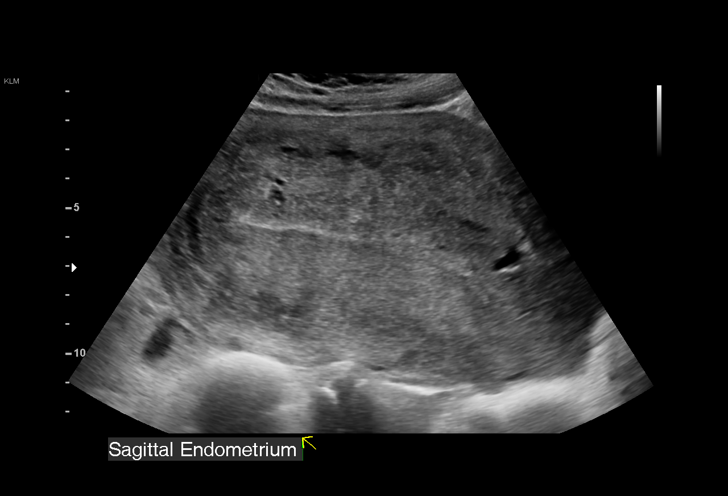
[im 27/27]
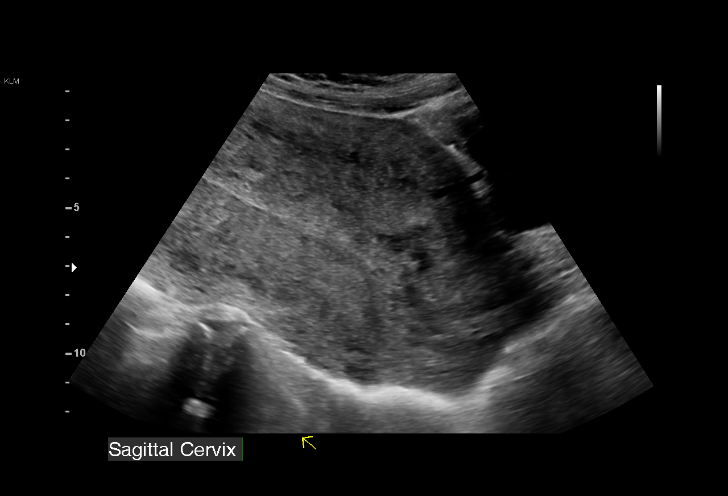

[15 of 25 positions shown; findings below may reference images not displayed]

FINDINGS: Uterus

Measurements: 16.0 x 9.5 x 10.7 cm = volume: 853 mL. Enlarged
postpartum appearance. Heterogeneous myometrium. No focal mass

Endometrium

Thickness: 5 mm. No endometrial fluid or focal abnormality. No
retained products of conception identified

Right ovary

Measurements: 2.7 x 1.6 x 2.2 cm = volume: 5.0 mL. Normal morphology
without mass

Left ovary

Measurements: 2.6 x 1.0 x 1.2 cm = volume: 1.6 mL. Normal morphology
without mass

Other findings:  No free pelvic fluid or adnexal masses.
IMPRESSION: Enlarged postpartum uterus.

Otherwise normal exam.
